# Patient Record
Sex: Female | Born: 1935 | Race: White | Hispanic: No | Smoking: Never smoker
Health system: Southern US, Community
[De-identification: ages and names within clinical notes are randomized; demographics above are authoritative.]

## PROBLEM LIST (undated history)

## (undated) DIAGNOSIS — I10 Essential (primary) hypertension: Secondary | ICD-10-CM

## (undated) DIAGNOSIS — B019 Varicella without complication: Secondary | ICD-10-CM

## (undated) HISTORY — DX: Varicella without complication: B01.9

---

## 1960-10-21 HISTORY — PX: BREAST SURGERY: SHX581

## 1960-10-21 HISTORY — PX: OTHER SURGICAL HISTORY: SHX169

## 2008-03-30 ENCOUNTER — Emergency Department (HOSPITAL_COMMUNITY): Admission: EM | Admit: 2008-03-30 | Discharge: 2008-03-30 | Payer: Self-pay | Admitting: Emergency Medicine

## 2008-03-31 ENCOUNTER — Ambulatory Visit: Payer: Self-pay | Admitting: Family Medicine

## 2008-03-31 DIAGNOSIS — R030 Elevated blood-pressure reading, without diagnosis of hypertension: Secondary | ICD-10-CM

## 2008-05-02 ENCOUNTER — Ambulatory Visit: Payer: Self-pay | Admitting: Family Medicine

## 2008-05-02 ENCOUNTER — Encounter: Payer: Self-pay | Admitting: Family Medicine

## 2008-05-03 ENCOUNTER — Ambulatory Visit: Payer: Self-pay | Admitting: Family Medicine

## 2008-05-03 ENCOUNTER — Encounter: Payer: Self-pay | Admitting: Family Medicine

## 2008-05-03 LAB — CONVERTED CEMR LAB
Cholesterol: 194 mg/dL
HDL: 65 mg/dL
LDL Cholesterol: 114 mg/dL — ABNORMAL HIGH
Total CHOL/HDL Ratio: 3
Triglycerides: 74 mg/dL
VLDL: 15 mg/dL

## 2008-05-04 ENCOUNTER — Encounter: Payer: Self-pay | Admitting: Family Medicine

## 2008-05-04 LAB — CONVERTED CEMR LAB: Pap Smear: NORMAL

## 2008-05-05 ENCOUNTER — Encounter: Payer: Self-pay | Admitting: Family Medicine

## 2008-05-17 ENCOUNTER — Encounter: Payer: Self-pay | Admitting: Family Medicine

## 2008-05-20 ENCOUNTER — Encounter: Payer: Self-pay | Admitting: Family Medicine

## 2009-03-13 IMAGING — CR DG CHEST 2V
2 series · 2 of 2 positions shown · non-contrast
Comparison: None

CLINICAL DATA: Nausea and vomiting.

CHEST - 2 VIEW

[w chest pa]
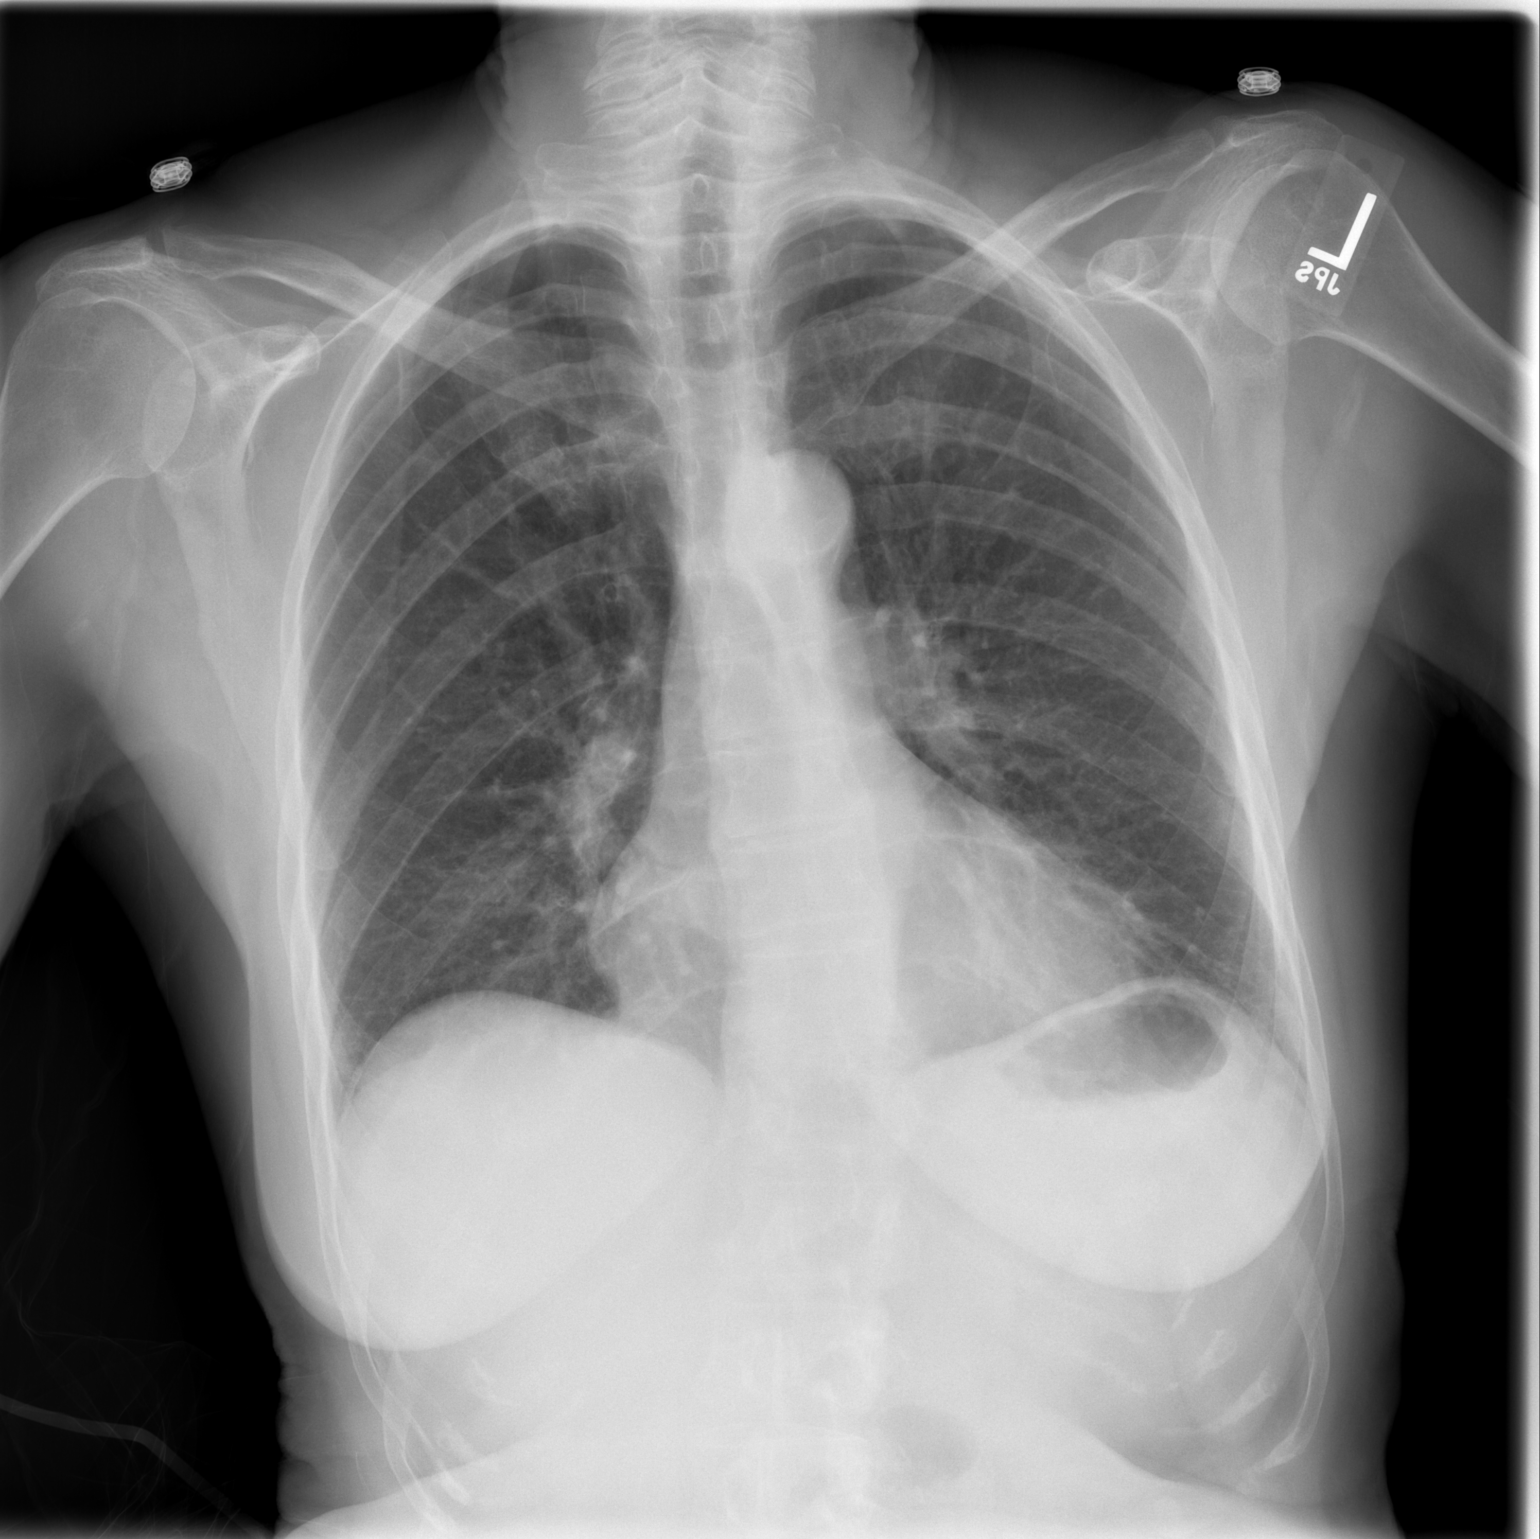

[w chest lat]
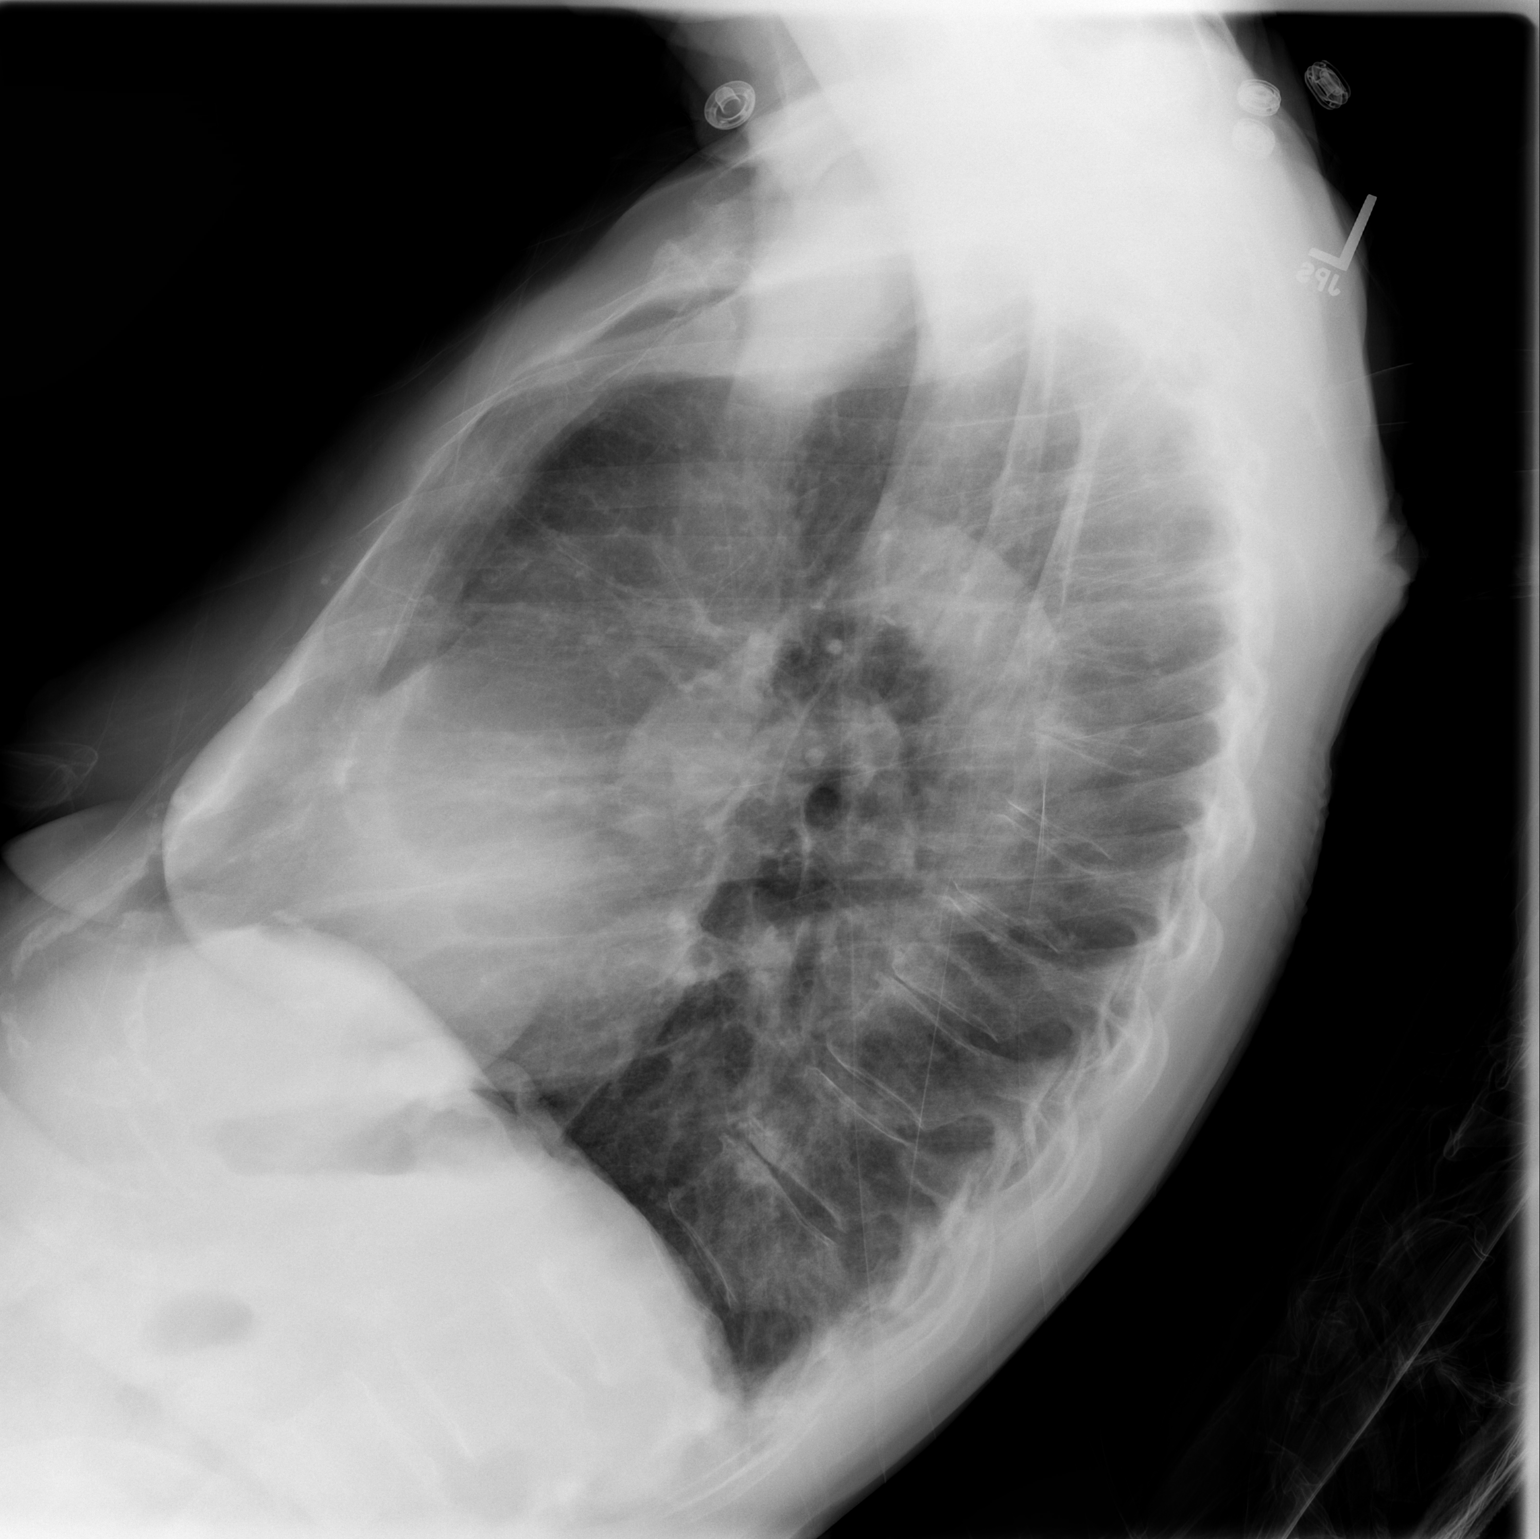

[2 of 2 positions shown; findings below may reference images not displayed]

FINDINGS: Heart size is upper normal.  There is no heart failure,
infiltrate, or effusion.  The lungs are clear and there is no mass
lesion.
IMPRESSION: No active cardiopulmonary disease.

## 2011-07-18 LAB — URINALYSIS, ROUTINE W REFLEX MICROSCOPIC
Bilirubin Urine: NEGATIVE
Glucose, UA: NEGATIVE
Hgb urine dipstick: NEGATIVE
Specific Gravity, Urine: 1.016
pH: 7.5

## 2011-07-18 LAB — COMPREHENSIVE METABOLIC PANEL
Albumin: 3.3 — ABNORMAL LOW
Alkaline Phosphatase: 62
BUN: 14
Potassium: 3.9
Total Protein: 5.6 — ABNORMAL LOW

## 2011-07-18 LAB — DIFFERENTIAL
Lymphocytes Relative: 9 — ABNORMAL LOW
Monocytes Absolute: 0.4
Monocytes Relative: 6
Neutro Abs: 6.1

## 2011-07-18 LAB — CBC
HCT: 38.7
Platelets: 234
RDW: 13.4

## 2012-12-18 ENCOUNTER — Encounter: Payer: Self-pay | Admitting: Family Medicine

## 2012-12-18 ENCOUNTER — Ambulatory Visit (INDEPENDENT_AMBULATORY_CARE_PROVIDER_SITE_OTHER): Payer: Medicare Other | Admitting: Family Medicine

## 2012-12-18 VITALS — BP 160/70 | HR 70 | Temp 98.1°F | Ht 66.0 in | Wt 126.0 lb

## 2012-12-18 DIAGNOSIS — Z0000 Encounter for general adult medical examination without abnormal findings: Secondary | ICD-10-CM

## 2012-12-18 DIAGNOSIS — Z23 Encounter for immunization: Secondary | ICD-10-CM

## 2012-12-18 DIAGNOSIS — R7309 Other abnormal glucose: Secondary | ICD-10-CM

## 2012-12-18 DIAGNOSIS — E785 Hyperlipidemia, unspecified: Secondary | ICD-10-CM

## 2012-12-18 DIAGNOSIS — R030 Elevated blood-pressure reading, without diagnosis of hypertension: Secondary | ICD-10-CM

## 2012-12-18 DIAGNOSIS — Z7189 Other specified counseling: Secondary | ICD-10-CM

## 2012-12-18 LAB — HEMOGLOBIN A1C: Hgb A1c MFr Bld: 5.7 % (ref 4.6–6.5)

## 2012-12-18 LAB — LIPID PANEL
HDL: 64.3 mg/dL (ref >39.00)
Total CHOL/HDL Ratio: 3
Triglycerides: 96 mg/dL (ref 0.0–149.0)

## 2012-12-18 NOTE — Patient Instructions (Addendum)
-  We have ordered labs or studies at this visit. It can take up to 1-2 weeks for results and processing. We will contact you with instructions IF your results are abnormal. Normal results will be released to your [facility]. If you have not heard from [place] or can not find your results in [facility] in 2 weeks please contact our office.  -PLEASE SIGN UP FOR MYCHART TODAY   We recommend the following healthy lifestyle measures: - eat a healthy diet consisting of lots of vegetables, fruits, beans, nuts, seeds, healthy meats such as white chicken and fish and whole grains.  - avoid fried foods, fast food, processed foods, sodas, red meet and other fattening foods.  - get a least 150 minutes of aerobic exercise per week.   Follow up in: 1-2 months to recheck your blood pressure and discuss labs

## 2012-12-18 NOTE — Progress Notes (Signed)
Chief Complaint  Patient presents with  . Establish Care    HPI:  Jill Jordan is here to establish care.  Last PCP and physical: last family doctor passed away, has not seen a doctor for regular care in a long time. Patient does self breast exams in shower every day. She does not do any other health maintenance measures.  Has the following chronic problems and concerns today:  Patient Active Problem List  Diagnosis  . ELEVATED BLOOD PRESSURE WITHOUT DIAGNOSIS OF HYPERTENSION   Health Maintenance: -never gets flu shots -can't remember last tdap  - wants this today -wants to do stool cards for colon cancer screening - refused colonoscopy -she refused shingles vaccine today - will think about it -refused mammogram - does self breast exams -wants to get pneumonia vaccine today   ROS: See pertinent positives and negatives per HPI. Feels great. Reports no CP, SOB, palpitations, bowel changes, weight changes, urinary symptoms.  Past Medical History  Diagnosis Date  . Chicken pox     Family History  Problem Relation Age of Onset  . Alcohol abuse Other   . Kidney disease Other   . Diabetes Other   . Hypertension Mother   . COPD Father     History   Social History  . Marital Status: Widowed    Spouse Name: N/A    Number of Children: N/A  . Years of Education: N/A   Social History Main Topics  . Smoking status: Never Smoker   . Smokeless tobacco: None  . Alcohol Use: No  . Drug Use: No  . Sexually Active: None   Other Topics Concern  . 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  EXAM:  Filed Vitals:   12/18/12 1254  BP:  160/70  Pulse: 70  Temp: 98.1 F (36.7 C)    Body mass index is 20.35 kg/(m^2).  GENERAL: vitals reviewed and listed above, alert, oriented, appears well hydrated and in no acute distress  HEENT: atraumatic, conjunttiva clear, no obvious abnormalities on inspection of external nose and ears  NECK: no obvious masses on inspection  LUNGS: clear to auscultation bilaterally, no wheezes, rales or rhonchi, good air movement  CV: HRRR, no peripheral edema  MS: moves all extremities without noticeable abnormality  PSYCH: pleasant and cooperative, no obvious depression or anxiety  ASSESSMENT AND PLAN:  Discussed the following assessment and plan:  ELEVATED BLOOD PRESSURE WITHOUT DIAGNOSIS OF HYPERTENSION  Establishing care with new doctor, encounter for  Hyperglycemia - screening for - Plan: Hemoglobin A1c  Hyperlipidemia - screening for - Plan: Lipid Panel  -We reviewed the PMH, PSH, FH, SH, Meds and Allergies.  -BP 142/80 on recheck - pt reluctant to take medications, will have her follow up in about 1-2 months to recheck BP and follow up on labs  Health Maintenance: -never gets flu shots - refused -can't remember last tdap  - wants this today - given -wants to do stool cards for colon cancer screening - refused colonoscopy, stool cards given -she  refused shingles vaccine today - will think about it -refused mammogram - does self breast exams -wants to get pneumonia vaccine today - given  -Patient advised to return or notify a doctor immediately if symptoms worsen or persist or new concerns arise.  Patient Instructions  -We have ordered labs or studies at this visit. It can take up to 1-2 weeks for results and processing. We will contact you with instructions IF your results are abnormal. Normal results will be released to your [facility]. If you have not heard from [place] or can not find your results in [facility] in 2 weeks please contact our office.  -PLEASE SIGN UP FOR MYCHART  TODAY   We recommend the following healthy lifestyle measures: - eat a healthy diet consisting of lots of vegetables, fruits, beans, nuts, seeds, healthy meats such as white chicken and fish and whole grains.  - avoid fried foods, fast food, processed foods, sodas, red meet and other fattening foods.  - get a least 150 minutes of aerobic exercise per week.   Follow up in: 1-2 months to recheck your blood pressure and discuss labs      KIM, HANNAH R.

## 2012-12-18 NOTE — Addendum Note (Signed)
Addended by: Azucena Freed on: 12/18/2012 01:46 PM   Modules accepted: Orders

## 2012-12-21 ENCOUNTER — Telehealth: Payer: Self-pay | Admitting: Family Medicine

## 2012-12-21 NOTE — Telephone Encounter (Signed)
Labs look ok. Cholesterol is a little high and diabetes screening lab is borderline. No medications needed at this time. Just make sure to keep exercising and eating a healthy diet.

## 2012-12-23 NOTE — Telephone Encounter (Signed)
Pt returned call and is aware of lab results

## 2012-12-23 NOTE — Telephone Encounter (Signed)
Left a message for pt to return call 

## 2013-01-07 ENCOUNTER — Other Ambulatory Visit (INDEPENDENT_AMBULATORY_CARE_PROVIDER_SITE_OTHER): Payer: Medicare Other

## 2013-01-07 ENCOUNTER — Telehealth: Payer: Self-pay | Admitting: Family Medicine

## 2013-01-07 DIAGNOSIS — R195 Other fecal abnormalities: Secondary | ICD-10-CM

## 2013-01-07 LAB — HEMOCCULT GUIAC POC 1CARD (OFFICE): Card #3 Date: NEGATIVE

## 2013-01-07 NOTE — Telephone Encounter (Signed)
Stool cards negative

## 2013-01-08 NOTE — Telephone Encounter (Signed)
Left a message for pt to return call 

## 2013-01-11 NOTE — Telephone Encounter (Signed)
Called and spoke with pt and pt is aware.  

## 2013-01-27 ENCOUNTER — Ambulatory Visit (INDEPENDENT_AMBULATORY_CARE_PROVIDER_SITE_OTHER): Payer: Medicare Other | Admitting: Family Medicine

## 2013-01-27 ENCOUNTER — Encounter: Payer: Self-pay | Admitting: Family Medicine

## 2013-01-27 VITALS — BP 170/74 | HR 74 | Temp 99.2°F | Wt 122.0 lb

## 2013-01-27 DIAGNOSIS — R7303 Prediabetes: Secondary | ICD-10-CM

## 2013-01-27 DIAGNOSIS — E785 Hyperlipidemia, unspecified: Secondary | ICD-10-CM

## 2013-01-27 DIAGNOSIS — R7309 Other abnormal glucose: Secondary | ICD-10-CM

## 2013-01-27 DIAGNOSIS — R030 Elevated blood-pressure reading, without diagnosis of hypertension: Secondary | ICD-10-CM

## 2013-01-27 NOTE — Progress Notes (Signed)
Chief Complaint  Patient presents with  . 1-2 month follow up    HPI:  Follow up:  Elevated BP last visit: -pt was reluctant to start medication and decided on lifestyle changes instead -lifestyle: has been doing lots of yard work, cleans housing, walking daily, has been trying to watch sodium - but does use salt of food -Denies: CP, SOB, HA, palpitations  HLD: -mild on recent labs  Prediabetes: -mild, on recent labs -no polyuria or polydipsia  ROS: See pertinent positives and negatives per HPI.  Past Medical History  Diagnosis Date  . Chicken pox     Family History  Problem Relation Age of Onset  . Alcohol abuse Other   . Kidney disease Other   . Diabetes Other   . Hypertension Mother   . COPD Father     History   Social History  . Marital Status: Widowed    Spouse Name: N/A    Number of Children: N/A  . Years of Education: N/A   Social History Main Topics  . Smoking status: Never Smoker   . Smokeless tobacco: None  . Alcohol Use: No  . Drug Use: No  . Sexually Active: None   Other Topics Concern  . 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  EXAM:  Filed Vitals:   01/27/13 1033  BP: 170/74  Pulse: 74  Temp: 99.2 F (37.3 C)  Recheck of BP after sitting 144/90  Body mass index is 19.7 kg/(m^2).  GENERAL: vitals reviewed and listed above, alert, oriented, appears well hydrated and in no acute distress  HEENT: atraumatic, conjunttiva clear, no obvious abnormalities on inspection of external nose and ears  NECK: no obvious masses on inspection  LUNGS: clear to auscultation bilaterally, no wheezes, rales or  rhonchi, good air movement  CV: HRRR, no peripheral edema  MS: moves all extremities without noticeable abnormality  PSYCH: pleasant and cooperative, no obvious depression or anxiety  ASSESSMENT AND PLAN:  Discussed the following assessment and plan:  ELEVATED BLOOD PRESSURE WITHOUT DIAGNOSIS OF HYPERTENSION  Hyperlipemia  Prediabetes  -discussed her hypertension, she feels like she has white coat hypertension, BP was better on recheck and she would prefer to not start a medicine at this time. She will purchase a home BP cuff and keep a home log to bring to the next appointment. -discussed healthy lifestyle changes -Patient advised to return or notify a doctor immediately if symptoms worsen or persist or new concerns arise.  Patient Instructions  -Buy a blood pressure cuff and check a few times per week - bring log and cuff to next appointment  We recommend the following healthy lifestyle measures: - eat a healthy diet consisting of lots of vegetables, fruits, beans, nuts, seeds, healthy meats such as white chicken and fish and whole grains.  - avoid fried foods, fast food, processed foods, sodas, red meet and other fattening foods.  - get a least 150 minutes of aerobic exercise per week.    Follow up in 2-3 months  Colin Benton R.

## 2013-01-27 NOTE — Patient Instructions (Signed)
-  Buy a blood pressure cuff and check a few times per week - bring log and cuff to next appointment  We recommend the following healthy lifestyle measures: - eat a healthy diet consisting of lots of vegetables, fruits, beans, nuts, seeds, healthy meats such as white chicken and fish and whole grains.  - avoid fried foods, fast food, processed foods, sodas, red meet and other fattening foods.  - get a least 150 minutes of aerobic exercise per week.    Follow up in 2-3 months

## 2013-03-31 ENCOUNTER — Ambulatory Visit (INDEPENDENT_AMBULATORY_CARE_PROVIDER_SITE_OTHER): Payer: Medicare Other | Admitting: Family Medicine

## 2013-03-31 ENCOUNTER — Encounter: Payer: Self-pay | Admitting: Family Medicine

## 2013-03-31 VITALS — BP 162/80 | Temp 97.7°F | Wt 120.0 lb

## 2013-03-31 DIAGNOSIS — IMO0001 Reserved for inherently not codable concepts without codable children: Secondary | ICD-10-CM | POA: Insufficient documentation

## 2013-03-31 DIAGNOSIS — I10 Essential (primary) hypertension: Secondary | ICD-10-CM

## 2013-03-31 NOTE — Patient Instructions (Addendum)
-  continue to check blood pressure several times per week and keep a log  -follow up in 6 months for physical exam

## 2013-03-31 NOTE — Progress Notes (Signed)
Chief Complaint  Patient presents with  . Hypertension    HPI:  Follow up:  Elevated BP: -pt has been very reluctant to start BP medication and feels she has white coat HTN - she is to bring home log today -home readings: ranging from 107/61-137/73 -denies: CP, HA, palpitations, swelling -she reports she always gets anxious about doctor's appts and always has elevated in office BP readings, but normal home BPs -she is very active and eats healthy  ROS: See pertinent positives and negatives per HPI.  Past Medical History  Diagnosis Date  . Chicken pox     Family History  Problem Relation Age of Onset  . Alcohol abuse Other   . Kidney disease Other   . Diabetes Other   . Hypertension Mother   . COPD Father     History   Social History  . Marital Status: Widowed    Spouse Name: N/A    Number of Children: N/A  . Years of Education: N/A   Social History Main Topics  . Smoking status: Never Smoker   . Smokeless tobacco: None  . Alcohol Use: No  . Drug Use: No  . Sexually Active: None   Other Topics Concern  . 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  EXAM:  Filed Vitals:   03/31/13 0843  BP: 162/80  Temp: 97.7 F (36.5 C)  Pt cuff reading: 173/85  Body mass index is 19.38 kg/(m^2).  GENERAL: vitals reviewed and listed above, alert, oriented, appears well hydrated and in no acute distress  HEENT: atraumatic, conjunttiva clear, no obvious abnormalities on inspection of external nose and ears  NECK: no obvious masses on inspection  LUNGS: clear to auscultation bilaterally, no wheezes, rales or rhonchi, good air  movement  CV: HRRR, no peripheral edema  MS: moves all extremities without noticeable abnormality  PSYCH: pleasant and cooperative, no obvious depression or anxiety  ASSESSMENT AND PLAN:  Discussed the following assessment and plan:  White coat hypertension  -it appears she does have likely white coat hypertension given home compared to office readings and cuff reading c/w office reading today -discussed implications -will follow and she will continue to check BP at home and keep a log -follow up in 6 months for preventive visit -Patient advised to return or notify a doctor immediately if symptoms worsen or persist or new concerns arise.   Patient Instructions  -continue to check blood pressure several times per week and keep a log  -follow up in 6 months for physical exam     Roby Donaway R.

## 2013-09-27 ENCOUNTER — Encounter: Payer: Self-pay | Admitting: Family Medicine

## 2013-09-27 ENCOUNTER — Ambulatory Visit (INDEPENDENT_AMBULATORY_CARE_PROVIDER_SITE_OTHER): Payer: Medicare Other | Admitting: Family Medicine

## 2013-09-27 VITALS — BP 132/84 | Temp 97.8°F | Ht 66.0 in | Wt 119.0 lb

## 2013-09-27 DIAGNOSIS — IMO0001 Reserved for inherently not codable concepts without codable children: Secondary | ICD-10-CM

## 2013-09-27 DIAGNOSIS — R030 Elevated blood-pressure reading, without diagnosis of hypertension: Secondary | ICD-10-CM

## 2013-09-27 DIAGNOSIS — I10 Essential (primary) hypertension: Secondary | ICD-10-CM

## 2013-09-27 DIAGNOSIS — Z0000 Encounter for general adult medical examination without abnormal findings: Secondary | ICD-10-CM

## 2013-09-27 NOTE — Progress Notes (Signed)
Pre visit review using our clinic review tool, if applicable. No additional management support is needed unless otherwise documented below in the visit note. 

## 2013-09-27 NOTE — Progress Notes (Signed)
Medicare Annual Preventive Care Visit  (initial annual wellness or annual wellness exam)  Concerns or follow up issues:  [facility]: -has home BP log, with cuff calibrated with office cuff -120-130s/60-70s, she checks several times per week -denies: CP, SOB, swelling, palpitation -always up at doctor appts - she gets nervous for appt  1.) Patient-completed health risk assessment  - completed and reviewed, see scanned documentation  2.) Review of Medical History: -PMH, PSH, Family History and current specialty and care providers reviewed and updated and listed below  - see chart and below  3.) Review of functional ability and level of safety:  Any difficulty hearing?  NO  History of falling? NO  Any trouble with IADLs - using a phone, using transportation, grocery shopping, preparing meals, doing housework, doing laundry, taking medications and managing money? NO  Advance Directives? YES   See summary of recommendations in Patient Instructions below.  4.) Physical Exam Filed Vitals:   09/27/13 0938  BP: 132/84  Temp: 97.8 F (36.6 C)   Estimated body mass index is 19.22 kg/(m^2) as calculated from the following:   Height as of this encounter: 5\' 6"  (1.676 m).   Weight as of this encounter: 119 lb (53.978 kg).  Mini Cog: 1. Patient instructed to listen carefully and repeat the following: Apple Watch    Penny  2. Clock drawing test was administered: NORMAL     ABNORMAL  3. Recall of three words: 3/3  Patient Score: NEG   See patient instructions for recommendations.  4)The following written screening schedule of preventive measures were reviewed with assessment and plan made per below, orders and patient instructions:           Alcohol screening: done     Obesity Screening and counseling: done     STI screening: offered     Tobacco Screening: done       Pneumococcal  ASSESSMENT/PLAN: done 11/2012      Screening mammograph (yearly if >40) ASSESSMENT/PLAN:  patient refused, does regular self breast exams      Screening Pap smear/pelvic exam (q2 years) ASSESSMENT/PLAN: N/A due to age and patient refused      Colorectal cancer screening (FOBT yearly or flex sig q4y or colonoscopy q10y or barium enema q4y) ASSESSMENT/PLAN: Doing stool cards yearly      Diabetes outpatient self-management training services ASSESSMENT/PLAN: N/A      Bone mass measurements(covered q2y if indicated - estrogen def, osteoporosis, hyperparathyroid, vertebral abnormalities, osteoporosis or steroids) ASSESSMENT/PLAN: Refused - advised vitamin D3 1000-2000 IU daily and calcium (1200mg  total from food and supplement)      Screening for glaucoma(q1y if high risk - diabetes, FH, AA and > 50 or hispanic and > 65) ASSESSMENT/PLAN: patient to set up eye appointment      Medical nutritional therapy for individuals with diabetes or renal disease ASSESSMENT/PLAN: N/A      Cardiovascular screening blood tests (lipids q5y) ASSESSMENT/PLAN: done      Diabetes screening tests ASSESSMENT/PLAN:done   7.) Summary: -risk factors and conditions per above assessment were discussed and treatment, recommendations and referrals were offered per documentation above and orders and patient instructions.

## 2013-09-27 NOTE — Patient Instructions (Signed)
Alcohol screening: done     Obesity Screening and counseling: done     STI screening: offered     Tobacco Screening: done       Pneumococcal  ASSESSMENT/PLAN: done 11/2012      Screening mammograph (yearly if >40) ASSESSMENT/PLAN: patient refused, does regular self breast exams      Screening Pap smear/pelvic exam (q2 years) ASSESSMENT/PLAN: N/A due to age and patient refused      Colorectal cancer screening (FOBT yearly or flex sig q4y or colonoscopy q10y or barium enema q4y) ASSESSMENT/PLAN: Doing stool cards yearly      Diabetes outpatient self-management training services ASSESSMENT/PLAN: N/A      Bone mass measurements(covered q2y if indicated - estrogen def, osteoporosis, hyperparathyroid, vertebral abnormalities, osteoporosis or steroids) ASSESSMENT/PLAN: Refused - advised vitamin D3 1000-2000 IU daily and calcium (1200mg  total from food and supplement)      Screening for glaucoma(q1y if high risk - diabetes, FH, AA and > 50 or hispanic and > 65) ASSESSMENT/PLAN: patient to set up eye appointment      Medical nutritional therapy for individuals with diabetes or renal disease ASSESSMENT/PLAN: N/A      Cardiovascular screening blood tests (lipids q5y) ASSESSMENT/PLAN: done      Diabetes screening tests ASSESSMENT/PLAN:done

## 2013-09-29 ENCOUNTER — Encounter: Payer: Medicare Other | Admitting: Family Medicine

## 2014-08-22 ENCOUNTER — Encounter: Payer: Self-pay | Admitting: Family Medicine

## 2019-12-12 ENCOUNTER — Ambulatory Visit: Payer: Medicare Other | Attending: Internal Medicine

## 2019-12-12 DIAGNOSIS — Z23 Encounter for immunization: Secondary | ICD-10-CM | POA: Insufficient documentation

## 2019-12-12 NOTE — Progress Notes (Signed)
   Covid-19 Vaccination Clinic  Name:  Jill Jordan    MRN: [id number] DOB: [birth year]  12/12/2019  Jill Jordan was observed post Covid-19 immunization for 15 minutes without incidence. She was provided with Vaccine Information Sheet and instruction to access the V-Safe system.   Jill Jordan was instructed to call 911 with any severe reactions post vaccine: Marland Kitchen Difficulty breathing  . Swelling of your face and throat  . A fast heartbeat  . A bad rash all over your body  . Dizziness and weakness    Immunizations Administered    Name Date Dose VIS Date Route   Pfizer COVID-19 Vaccine 12/12/2019 10:44 AM 0.3 mL 10/01/2019 Intramuscular   Manufacturer: [place]   Lot: [id number]   [place]: [id number]

## 2020-01-05 ENCOUNTER — Ambulatory Visit: Payer: Medicare Other | Attending: Internal Medicine

## 2020-01-05 DIAGNOSIS — Z23 Encounter for immunization: Secondary | ICD-10-CM

## 2020-01-05 NOTE — Progress Notes (Signed)
   Covid-19 Vaccination Clinic  Name:  Tayten Crittenden    MRN: [id number] DOB: 09-30-1932  01/05/2020  Ms. Odriscoll was observed post Covid-19 immunization for 15 minutes without incident. She was provided with Vaccine Information Sheet and instruction to access the V-Safe system.   Ms. Freshley was instructed to call 911 with any severe reactions post vaccine: Marland Kitchen Difficulty breathing  . Swelling of face and throat  . A fast heartbeat  . A bad rash all over body  . Dizziness and weakness   Immunizations Administered    Name Date Dose VIS Date Route   Pfizer COVID-19 Vaccine 01/05/2020 10:18 AM 0.3 mL 10/01/2019 Intramuscular   Manufacturer: [place]   Lot: [id number]   [place]: [id number]

## 2020-04-17 ENCOUNTER — Other Ambulatory Visit: Payer: Self-pay

## 2020-04-17 ENCOUNTER — Encounter: Payer: Self-pay | Admitting: Family Medicine

## 2020-04-17 ENCOUNTER — Ambulatory Visit: Payer: Medicare Other | Admitting: Family Medicine

## 2020-04-17 VITALS — BP 160/80 | HR 100 | Temp 97.6°F | Ht 66.0 in | Wt 120.2 lb

## 2020-04-17 DIAGNOSIS — R5383 Other fatigue: Secondary | ICD-10-CM | POA: Diagnosis not present

## 2020-04-17 DIAGNOSIS — Z13220 Encounter for screening for lipoid disorders: Secondary | ICD-10-CM

## 2020-04-17 DIAGNOSIS — E559 Vitamin D deficiency, unspecified: Secondary | ICD-10-CM | POA: Diagnosis not present

## 2020-04-17 DIAGNOSIS — E538 Deficiency of other specified B group vitamins: Secondary | ICD-10-CM | POA: Diagnosis not present

## 2020-04-17 DIAGNOSIS — I10 Essential (primary) hypertension: Secondary | ICD-10-CM

## 2020-04-17 NOTE — Progress Notes (Signed)
Jill Jordan DOB: 08/04/1932 Encounter date: 04/17/2020  This is a 86 y.o. female who presents to establish care. Chief Complaint  Patient presents with  . Establish Care    History of present illness: Has not been seen in our office since 2014  "I don't have no energy". Felt like there was a change when she turned 88.   Maybe sleeps 5-6 hours then gets up. Up more if she is drinking more. Generally feels rested when she gets up. She is always working; has garden. Legs just get tired. Feels better after rest. Not sure if weak or not wanting to carry body. No numbness, tingling in legs. Does have back pain if bent over picking beans for a couple of hours.   Does some general housework.   Doesn't check her blood pressure at home.   After COVID shots when she went to turn over felt swimmy headed, which lasted 2-3 weeks intermittently.   Appetite has been good. Eats a well rounded diet. Eats healthy; enjoys vegetables.   Does take Vitamin D3 daily (has K in it)   Past Medical History:  Diagnosis Date  . Chicken pox    Past Surgical History:  Procedure Laterality Date  . BREAST SURGERY Left 1962  . removal of small tumor left breast  1962   No Known Allergies Current Meds  Medication Sig  . VITAMIN D PO Take by mouth daily.   Social History   Tobacco Use  . Smoking status: Never Smoker  . Smokeless tobacco: Never Used  Substance Use Topics  . Alcohol use: No   Family History  Problem Relation Age of Onset  . Alcohol abuse Other   . Kidney disease Other   . Hypertension Mother 18       lived to be 91  . COPD Father   . Heart disease Brother   . Healthy Brother   . Diabetes Neg Hx   . Cancer Neg Hx      Review of Systems  Constitutional: Positive for fatigue. Negative for activity change, appetite change, chills, fever and unexpected weight change.  HENT: Negative for congestion, ear pain, hearing loss, sinus pressure, sinus pain, sore throat and trouble  swallowing.   Eyes: Negative for pain and visual disturbance.  Respiratory: Negative for cough, chest tightness, shortness of breath and wheezing.   Cardiovascular: Negative for chest pain, palpitations and leg swelling.  Gastrointestinal: Negative for abdominal pain, blood in stool, constipation, diarrhea, nausea and vomiting.  Genitourinary: Negative for difficulty urinating and menstrual problem.  Musculoskeletal: Negative for arthralgias and back pain.  Skin: Negative for rash.  Neurological: Negative for dizziness, weakness, numbness and headaches.  Hematological: Negative for adenopathy. Does not bruise/bleed easily.  Psychiatric/Behavioral: Negative for sleep disturbance and suicidal ideas. The patient is not nervous/anxious.     Objective:  BP (!) 160/80 (BP Location: Left Arm, Patient Position: Sitting, Cuff Size: Normal)   Pulse 100   Temp 97.6 F (36.4 C) (Temporal)   Ht 5\' 6"  (1.676 m)   Wt 120 lb 3.2 oz (54.5 kg)   BMI 19.40 kg/m   Weight: 120 lb 3.2 oz (54.5 kg)   BP Readings from Last 3 Encounters:  04/17/20 (!) 160/80  09/27/13 132/84  03/31/13 (!) 162/80   Wt Readings from Last 3 Encounters:  04/17/20 120 lb 3.2 oz (54.5 kg)  09/27/13 119 lb (54 kg)  03/31/13 120 lb (54.4 kg)   Patient's home blood pressure this morning was 165/80 with  a heart rate of 63.  My recheck in the office of blood pressure was 170/60 with a heart rate of 58 noted on EKG.  Physical Exam Constitutional:      General: She is not in acute distress.    Appearance: She is well-developed.  Cardiovascular:     Rate and Rhythm: Normal rate and regular rhythm.     Pulses:          Dorsalis pedis pulses are 1+ on the right side and 1+ on the left side.     Heart sounds: Murmur heard.  Systolic murmur is present with a grade of 2/6.  No friction rub.  Pulmonary:     Effort: Pulmonary effort is normal. No respiratory distress.     Breath sounds: Normal breath sounds. No wheezing or rales.   Musculoskeletal:     Right lower leg: No edema.     Left lower leg: No edema.  Neurological:     Mental Status: She is alert and oriented to person, place, and time.  Psychiatric:        Attention and Perception: Attention normal.        Mood and Affect: Mood normal.        Speech: Speech normal.        Behavior: Behavior normal.        Cognition and Memory: Cognition normal.     Assessment/Plan:  1. Fatigue, unspecified type EKG does show right bundle branch block, but I do not have prior for comparison.  We will start with blood work, and consider further evaluation pending these results.  We discussed possibility of cardiology work-up, perhaps including 2D echo.  Overall, her energy level is quite good for age she is very active for long hours in her garden and with canning daily.  It has however been a decrease in energy level for her in recent months, so we will further investigate.  She would like to do what she can do to prevent heart attack or stroke.  EKG is bradycardic with no acute changes.  Right bundle branch block noted. - EKG 12-Lead - CBC with Differential/Platelet; Future - Comprehensive metabolic panel; Future - TSH; Future - Urinalysis with Reflex Microscopic; Future - Culture, Urine; Future  2. Lipid screening - Lipid panel; Future  3. Vitamin D deficiency - VITAMIN D 25 Hydroxy (Vit-D Deficiency, Fractures); Future  4. B12 deficiency - Vitamin B12; Future  5.  Elevated blood pressure I am concerned with pulse pressure elevation.  Diastolic for me on 2 checks in the office was around 60, high 50s.  Systolic was 1 17-9 70 in the office.  I am going to have her check blood pressures at home and report back numbers to me.  In the meanwhile we will get blood work.  We discussed being cautious not to over treat blood pressure since that may cause her to feel more dizzy or lightheaded.  Follow-up pending blood work results.  Return for pending  bloodwork.  Micheline Rough, MD   Time spent with patient, chart review and charting, discussion of lab work and potential evaluation 40 minutes total.

## 2022-09-10 ENCOUNTER — Ambulatory Visit: Payer: Medicare Other | Admitting: Family Medicine

## 2022-09-10 ENCOUNTER — Encounter: Payer: Self-pay | Admitting: Family Medicine

## 2022-09-10 VITALS — BP 120/60 | HR 63 | Temp 98.1°F | Ht 66.0 in | Wt 113.2 lb

## 2022-09-10 DIAGNOSIS — N6320 Unspecified lump in the left breast, unspecified quadrant: Secondary | ICD-10-CM | POA: Insufficient documentation

## 2022-09-10 DIAGNOSIS — N6323 Unspecified lump in the left breast, lower outer quadrant: Secondary | ICD-10-CM

## 2022-09-10 NOTE — Progress Notes (Signed)
   Established Patient Office Visit  Subjective   Patient ID: Shantanique Hodo, female    DOB: 02-27-1932  Age: 88 y.o. MRN: [id number]  Chief Complaint  Patient presents with  . Breast Mass    Patient complains of painful lump noted in the left breast x2 weeks, also states she had a lump in 1962 which was normal after testing    Pt is reporting 2 week history of a lump in the left breast. States she thinks it is getting bigger in size. It is not tender, states she can move it, states it feels hard like a marble.   {History (Optional):23778}  ROS    Objective:     BP 120/60 (BP Location: Left Arm, Patient Position: Sitting, Cuff Size: Normal)   Pulse 63   Temp 98.1 F (36.7 C) (Oral)   Ht '5\' 6"'$  (1.676 m)   Wt 113 lb 3.2 oz (51.3 kg)   SpO2 98%   BMI 18.27 kg/m  {Vitals History (Optional):23777}  Physical Exam   No results found for any visits on 09/10/22.  {Labs (Optional):23779}  The ASCVD Risk score (Arnett DK, et al., 2019) failed to calculate for the following reasons:   The 2019 ASCVD risk score is only valid for ages 74 to 14    Assessment & Plan:   Problem List Items Addressed This Visit   None   No follow-ups on file.    Farrel Conners, MD

## 2022-09-16 ENCOUNTER — Encounter: Payer: Medicare Other | Admitting: Family Medicine

## 2022-09-17 ENCOUNTER — Emergency Department (HOSPITAL_BASED_OUTPATIENT_CLINIC_OR_DEPARTMENT_OTHER)
Admission: EM | Admit: 2022-09-17 | Discharge: 2022-09-17 | Disposition: A | Discharge status: Home / Self Care | Payer: Medicare Other | Attending: Emergency Medicine | Admitting: Emergency Medicine

## 2022-09-17 ENCOUNTER — Encounter (HOSPITAL_COMMUNITY): Payer: Self-pay | Admitting: Emergency Medicine

## 2022-09-17 ENCOUNTER — Emergency Department (HOSPITAL_COMMUNITY): Admission: EM | Admit: 2022-09-17 | Payer: Medicare Other | Source: Home / Self Care

## 2022-09-17 ENCOUNTER — Other Ambulatory Visit: Payer: Self-pay

## 2022-09-17 ENCOUNTER — Encounter (HOSPITAL_BASED_OUTPATIENT_CLINIC_OR_DEPARTMENT_OTHER): Payer: Self-pay | Admitting: Emergency Medicine

## 2022-09-17 DIAGNOSIS — R55 Syncope and collapse: Secondary | ICD-10-CM

## 2022-09-17 DIAGNOSIS — R030 Elevated blood-pressure reading, without diagnosis of hypertension: Secondary | ICD-10-CM

## 2022-09-17 DIAGNOSIS — R42 Dizziness and giddiness: Secondary | ICD-10-CM

## 2022-09-17 LAB — COMPREHENSIVE METABOLIC PANEL
ALT: 12 U/L (ref 0–44)
AST: 15 U/L (ref 15–41)
Albumin: 3.6 g/dL (ref 3.5–5.0)
Alkaline Phosphatase: 72 U/L (ref 38–126)
Anion gap: 12 (ref 5–15)
BUN: 12 mg/dL (ref 8–23)
CO2: 27 mmol/L (ref 22–32)
Calcium: 9.2 mg/dL (ref 8.9–10.3)
Chloride: 100 mmol/L (ref 98–111)
Creatinine, Ser: 0.81 mg/dL (ref 0.44–1.00)
GFR, Estimated: >60 mL/min (ref >60)
Glucose, Bld: 106 mg/dL — ABNORMAL HIGH (ref 70–99)
Potassium: 4.3 mmol/L (ref 3.5–5.1)
Sodium: 139 mmol/L (ref 135–145)
Total Bilirubin: 0.7 mg/dL (ref 0.3–1.2)
Total Protein: 6.2 g/dL — ABNORMAL LOW (ref 6.5–8.1)

## 2022-09-17 LAB — CBC WITH DIFFERENTIAL/PLATELET
Abs Immature Granulocytes: 0.03 10*3/uL (ref 0.00–0.07)
Basophils Absolute: 0 10*3/uL (ref 0.0–0.1)
Basophils Relative: 1 %
Eosinophils Absolute: 0 10*3/uL (ref 0.0–0.5)
Eosinophils Relative: 0 %
HCT: 43.1 % (ref 36.0–46.0)
Hemoglobin: 13.4 g/dL (ref 12.0–15.0)
Immature Granulocytes: 1 %
Lymphocytes Relative: 17 %
Lymphs Abs: 0.9 10*3/uL (ref 0.7–4.0)
MCH: 26.1 pg (ref 26.0–34.0)
MCHC: 31.1 g/dL (ref 30.0–36.0)
MCV: 83.9 fL (ref 80.0–100.0)
Monocytes Absolute: 0.4 10*3/uL (ref 0.1–1.0)
Monocytes Relative: 8 %
Neutro Abs: 4 10*3/uL (ref 1.7–7.7)
Neutrophils Relative %: 73 %
Platelets: 292 10*3/uL (ref 150–400)
RBC: 5.14 MIL/uL — ABNORMAL HIGH (ref 3.87–5.11)
RDW: 16.5 % — ABNORMAL HIGH (ref 11.5–15.5)
WBC: 5.5 10*3/uL (ref 4.0–10.5)
nRBC: 0 % (ref 0.0–0.2)

## 2022-09-17 LAB — URINALYSIS, ROUTINE W REFLEX MICROSCOPIC
Bilirubin Urine: NEGATIVE
Glucose, UA: NEGATIVE mg/dL
Hgb urine dipstick: NEGATIVE
Ketones, ur: NEGATIVE mg/dL
Leukocytes,Ua: NEGATIVE
Nitrite: NEGATIVE
Protein, ur: NEGATIVE mg/dL
Specific Gravity, Urine: 1.005 (ref 1.005–1.030)
pH: 7 (ref 5.0–8.0)

## 2022-09-17 MED ORDER — SODIUM CHLORIDE 0.9 % IV BOLUS
1000.0000 mL | Freq: Once | INTRAVENOUS | Status: AC
  Administered 2022-09-17: 1000 mL via INTRAVENOUS

## 2022-09-17 NOTE — ED Triage Notes (Signed)
Pt arrives to ED with c/o dizziness and nausea. These symptoms started yesterday and have continued into today. Had CBC, CMP, EKG completed at [facility] ED today before she LWBS. HTN in triage, no past hx of same.

## 2022-09-17 NOTE — ED Provider Notes (Signed)
[facility] EMERGENCY DEPT Provider Note   CSN: [id number] Arrival date & time: 09/17/22  1451     History  Chief Complaint  Patient presents with   Dizziness   Nausea   Hypertension    Jill Jordan is a 88 y.o. female.  Pt indicates intermittently  feeling lightheaded since yesterday AM. Symptoms come and go, symptoms somewhat worse w standing. No syncope. No associated palpitations or sense of rapid or irregular beating. No chest pain or discomfort of any sort. No sob or unusual doe. No abd pain or nvd. No dysuria or gu c/o. No recent blood loss, rectal bleeding or melena. No recent change in meds. No cough or uri symptoms. No fever or chills. Pt denies headache. No change in speech or vision. No room spinning. No tinnitus, ear pain or hearing loss. No problems w gait, walks on own without cane/walker, no unsteadiness.   The history is provided by the patient and medical records.  Dizziness Associated symptoms: no blood in stool, no chest pain, no diarrhea, no headaches, no palpitations, no shortness of breath, no vomiting and no weakness   Hypertension Pertinent negatives include no chest pain, no abdominal pain, no headaches and no shortness of breath.       Home Medications Prior to Admission medications   Medication Sig Start Date End Date Taking? Authorizing Provider  Multiple Vitamins-Minerals (MULTIVITAMIN GUMMIES ADULT PO) Take by mouth daily.    [provider]  VITAMIN D PO Take by mouth daily.    [provider]      Allergies    Patient has no known allergies.    Review of Systems   Review of Systems  Constitutional:  Negative for chills, diaphoresis and fever.  HENT:  Negative for sore throat.   Eyes:  Negative for visual disturbance.  Respiratory:  Negative for cough and shortness of breath.   Cardiovascular:  Negative for chest pain, palpitations and leg swelling.  Gastrointestinal:  Negative for abdominal pain, blood in  stool, diarrhea and vomiting.  Genitourinary:  Negative for dysuria and flank pain.  Musculoskeletal:  Negative for back pain and neck pain.  Skin:  Negative for rash.  Neurological:  Positive for dizziness and light-headedness. Negative for speech difficulty, weakness, numbness and headaches.  Hematological:  Does not bruise/bleed easily.  Psychiatric/Behavioral:  Negative for confusion.     Physical Exam Updated Vital Signs BP (!) 171/71   Pulse 64   Temp 98.3 F (36.8 C)   Resp 14   Ht 1.676 m ('5\' 6"'$ )   Wt 51.3 kg   SpO2 100%   BMI 18.24 kg/m  Physical Exam Vitals and nursing note reviewed.  Constitutional:      Appearance: Normal appearance. She is well-developed.  HENT:     Head: Atraumatic.     Nose: Nose normal.     Mouth/Throat:     Mouth: Mucous membranes are moist.  Eyes:     General: No scleral icterus.    Conjunctiva/sclera: Conjunctivae normal.     Pupils: Pupils are equal, round, and reactive to light.  Neck:     Vascular: No carotid bruit.     Trachea: No tracheal deviation.     Comments: Trachea midline. Thyroid not grossly enlarged or tender.  Cardiovascular:     Rate and Rhythm: Normal rate and regular rhythm.     Pulses: Normal pulses.     Heart sounds: Normal heart sounds. No murmur heard.    No friction  rub. No gallop.  Pulmonary:     Effort: Pulmonary effort is normal. No respiratory distress.     Breath sounds: Normal breath sounds.  Abdominal:     General: Bowel sounds are normal. There is no distension.     Palpations: Abdomen is soft. There is no mass.     Tenderness: There is no abdominal tenderness.  Genitourinary:    Comments: No cva tenderness.  Musculoskeletal:        General: No swelling or tenderness.     Cervical back: Normal range of motion and neck supple. No rigidity. No muscular tenderness.     Right lower leg: No edema.     Left lower leg: No edema.  Skin:    General: Skin is warm and dry.     Findings: No rash.   Neurological:     Mental Status: She is alert.     Comments: Alert, speech normal. No dysarthria or aphasia. Motor fxn intact bil, stre 5/5, no pronator drift. Sens grossly intact bil. Steady gait, no ataxia.   Psychiatric:        Mood and Affect: Mood normal.     ED Results / Procedures / Treatments   Labs (all labs ordered are listed, but only abnormal results are displayed) Labs from today, k normal. Hgb normal.      EKG EKG Interpretation  Date/Time:  Tuesday September 17 2022 20:16:55 EST Ventricular Rate:  66 PR Interval:  203 QRS Duration: 136 QT Interval:  472 QTC Calculation: 495 R Axis:   61 Text Interpretation: Sinus rhythm Right bundle branch block Non-specific ST-t changes Confirmed by Lajean Saver [phone]) on 09/17/2022 8:20:36 PM  Radiology No results found.  Procedures Procedures    Medications Ordered in ED Medications  sodium chloride 0.9 % bolus 1,000 mL (1,000 mLs Intravenous New Bag/Given 09/17/22 1951)    ED Course/ Medical Decision Making/ A&P                           Medical Decision Making Problems Addressed: Elevated blood pressure reading: acute illness or injury Lightheadedness: acute illness or injury with systemic symptoms Near syncope: acute illness or injury with systemic symptoms that poses a threat to life or bodily functions  Amount and/or Complexity of Data Reviewed External Data Reviewed: notes. Labs:  Decision-making details documented in ED Course. ECG/medicine tests: ordered and independent interpretation performed. Decision-making details documented in ED Course.  Risk Decision regarding hospitalization.   Iv ns. Continuous pulse ox and cardiac monitoring. Labs ordered/sent. Imaging ordered.   Diff dx includes anemia, dehydration, orthostasis, etc, - dispo decision including potential need for admission considered - will check earlier labs, monitor, ecg, check orthostasics, give fluids, and reassess.   Reviewed  nursing notes and prior charts for additional history. External reports reviewed - had labs earlier today in other ED, but wait too long.   Cardiac monitor: sinus rhythm, rate 64.  Earlier labs reviewed/interpreted by me - chem normal. Hgb normal. Ua neg for uti.   Ns bolus. Po fluids/food. Ambulated in hall.  Pt with steady gait. No faintness or current symptoms. Systolic bp elevated, 962/95, but does not require emergently lowering - rec pcp f/u.  Rec close pcp f/u.  Return precautions provided.            Final Clinical Impression(s) / ED Diagnoses Final diagnoses:  None    Rx / DC Orders ED Discharge Orders  None         Lajean Saver, MD 09/17/22 2035

## 2022-09-17 NOTE — Discharge Instructions (Addendum)
It was our pleasure to provide your ER care today - we hope that you feel better.  Drink plenty of fluids/stay well hydrated.  Your blood pressure is high - limit salt intake, see attached information, and follow up closely with your doctor.   Follow up closely with your doctor in the next 1-2 days - call office tomorrow AM to arrange appointment. Make sure to have your blood pressure rechecked then, as it is high today.  Return to ER right away if worse, new symptoms, chest pain, trouble breathing, fainting, fevers, trouble with walking/balance, numbness/weakness, change in speech or vision, or other concern.

## 2022-09-17 NOTE — ED Notes (Signed)
No longer wanted to wait

## 2022-09-17 NOTE — ED Provider Triage Note (Signed)
Emergency Medicine Provider Triage Evaluation Note  Leone Mobley , a 88 y.o. female  was evaluated in triage.  Pt complains of 1 day of increased dizziness.  Patient states she woke up yesterday and felt dizzy.  She describes the dizziness as feeling off balance.  She denies feeling like the room is spinning.  She states that she was dehydrated in the past which caused similar symptoms.  She denies any nausea, vomiting, chest pain, shortness of breath, headache.  Review of Systems  Positive: As above Negative: As above  Physical Exam  BP (!) 217/86 (BP Location: Left Arm)   Pulse 68   Temp 97.8 F (36.6 C) (Oral)   Resp 15   SpO2 99%  Gen:   Awake, no distress   Resp:  Normal effort  MSK:   Moves extremities without difficulty  Other:    Medical Decision Making  Medically screening exam initiated at 10:48 AM.  Appropriate orders placed.  Jailyne Chieffo was informed that the remainder of the evaluation will be completed by another provider, this initial triage assessment does not replace that evaluation, and the importance of remaining in the ED until their evaluation is complete.     Dorothyann Peng, PA-C 09/17/22 1051

## 2022-09-17 NOTE — ED Triage Notes (Signed)
Pt complains of feeling dizzy and nauseous. Pt denies cp/sob/ headache. Pt denies any weakness or gait abnormality

## 2022-09-19 ENCOUNTER — Encounter: Payer: Self-pay | Admitting: Family Medicine

## 2022-09-19 ENCOUNTER — Ambulatory Visit: Payer: Medicare Other | Admitting: Family Medicine

## 2022-09-19 VITALS — BP 180/90 | HR 63 | Temp 97.7°F | Ht 66.0 in | Wt 114.5 lb

## 2022-09-19 DIAGNOSIS — I10 Essential (primary) hypertension: Secondary | ICD-10-CM | POA: Diagnosis not present

## 2022-09-19 DIAGNOSIS — N6323 Unspecified lump in the left breast, lower outer quadrant: Secondary | ICD-10-CM

## 2022-09-19 DIAGNOSIS — Z13220 Encounter for screening for lipoid disorders: Secondary | ICD-10-CM | POA: Diagnosis not present

## 2022-09-19 LAB — LIPID PANEL
Cholesterol: 205 mg/dL — ABNORMAL HIGH (ref 0–200)
HDL: 61.2 mg/dL (ref >39.00)
LDL Cholesterol: 118 mg/dL — ABNORMAL HIGH (ref 0–99)
NonHDL: 143.45
Total CHOL/HDL Ratio: 3
Triglycerides: 129 mg/dL (ref 0.0–149.0)
VLDL: 25.8 mg/dL (ref 0.0–40.0)

## 2022-09-19 LAB — TSH: TSH: 2.31 u[IU]/mL (ref 0.35–5.50)

## 2022-09-19 MED ORDER — AMLODIPINE BESYLATE 5 MG PO TABS: 5.0000 mg | ORAL_TABLET | Freq: Every day | ORAL | 1 refills | Status: DC

## 2022-09-19 NOTE — Patient Instructions (Signed)
Check blood pressure daily, your goal BP is anything less than 140/90,

## 2022-09-19 NOTE — Assessment & Plan Note (Signed)
Pt has consistently elevated blood pressures, we discussed starting amlodipine and she is agreeable. Will start 5 mg daily at bedtime and pt will continue to check her BP at home. Will RTC in 2 months for BP recheck. I will order TSH and lipid panel for risk factor assessment. I will also order renal artery duplex to rule out RAS.

## 2022-09-19 NOTE — Progress Notes (Signed)
Established Patient Office Visit  Subjective   Patient ID: Jill Jordan, female    DOB: [birth year]  Age: 88 y.o. MRN: [id number]  Chief Complaint  Patient presents with   Follow-up    Patient states she went to the ER 2 days ago due to elevated blood pressure at home with reading of 184/80    Patient reports she was in the ED for the dizziness and lightheadedness. She reports that they told her that her BP was elevated. States she was told to follow up. She reports no headaches, no numbness or tingling, no difficulty with speech, no falls. BP today in office is 180/90. We discussed starting blood pressure medications and she is agreeable.     Patient Active Problem List   Diagnosis Date Noted   HTN (hypertension) 09/19/2022   Breast mass, left 09/10/2022   White coat hypertension 03/31/2013   ELEVATED BLOOD PRESSURE WITHOUT DIAGNOSIS OF HYPERTENSION 03/31/2008      Review of Systems  All other systems reviewed and are negative.     Objective:     BP (!) 180/90 (BP Location: Left Arm, Patient Position: Sitting, Cuff Size: Normal)   Pulse 63   Temp 97.7 F (36.5 C) (Oral)   Ht '5\' 6"'$  (1.676 m)   Wt 114 lb 8 oz (51.9 kg)   SpO2 97%   BMI 18.48 kg/m    Physical Exam Vitals reviewed.  Constitutional:      Appearance: Normal appearance. She is well-groomed and normal weight.  Eyes:     Conjunctiva/sclera: Conjunctivae normal.  Neck:     Thyroid: No thyromegaly.  Cardiovascular:     Rate and Rhythm: Normal rate and regular rhythm.     Pulses: Normal pulses.     Heart sounds: S1 normal and S2 normal.  Pulmonary:     Effort: Pulmonary effort is normal.     Breath sounds: Normal breath sounds and air entry.  Abdominal:     General: Bowel sounds are normal.  Musculoskeletal:     Right lower leg: No edema.     Left lower leg: No edema.  Neurological:     Mental Status: She is alert and oriented to person, place, and time. Mental status is at baseline.     Gait:  Gait is intact.  Psychiatric:        Mood and Affect: Mood and affect normal.        Speech: Speech normal.        Behavior: Behavior normal.        Judgment: Judgment normal.      No results found for any visits on 09/19/22.  Last CBC Lab Results  Component Value Date   WBC 5.5 09/17/2022   HGB 13.4 09/17/2022   HCT 43.1 09/17/2022   MCV 83.9 09/17/2022   MCH 26.1 09/17/2022   RDW 16.5 (H) 09/17/2022   PLT 292 78/67/6720   Last metabolic panel Lab Results  Component Value Date   GLUCOSE 106 (H) 09/17/2022   NA 139 09/17/2022   K 4.3 09/17/2022   CL 100 09/17/2022   CO2 27 09/17/2022   BUN 12 09/17/2022   CREATININE 0.81 09/17/2022   GFRNONAA >60 09/17/2022   CALCIUM 9.2 09/17/2022   PROT 6.2 (L) 09/17/2022   ALBUMIN 3.6 09/17/2022   BILITOT 0.7 09/17/2022   ALKPHOS 72 09/17/2022   AST 15 09/17/2022   ALT 12 09/17/2022   ANIONGAP 12 09/17/2022      The ASCVD  Risk score (Arnett DK, et al., 2019) failed to calculate for the following reasons:   The 2019 ASCVD risk score is only valid for ages 55 to 38    Assessment & Plan:   Problem List Items Addressed This Visit       Unprioritized   Breast mass, left   Relevant Orders   MM Digital Diagnostic Unilat L   HTN (hypertension) - Primary    Pt has consistently elevated blood pressures, we discussed starting amlodipine and she is agreeable. Will start 5 mg daily at bedtime and pt will continue to check her BP at home. Will RTC in 2 months for BP recheck. I will order TSH and lipid panel for risk factor assessment. I will also order renal artery duplex to rule out RAS.       Relevant Medications   amLODipine (NORVASC) 5 MG tablet   Other Relevant Orders   TSH   VAS US RENAL ARTERY DUPLEX   Other Visit Diagnoses     Lipid screening       Relevant Orders   Lipid Panel       Return in about 2 months (around 11/19/2022) for BP follow up.    Farrel Conners, MD

## 2022-09-20 ENCOUNTER — Telehealth (HOSPITAL_BASED_OUTPATIENT_CLINIC_OR_DEPARTMENT_OTHER): Payer: Self-pay | Admitting: *Deleted

## 2022-09-20 ENCOUNTER — Telehealth: Payer: Self-pay | Admitting: Family Medicine

## 2022-09-20 NOTE — Telephone Encounter (Signed)
[facility] with [facility] needs the authorization for the Renal Artery Duplex -  Please add the note into the order and she will get it from there.

## 2022-09-20 NOTE — Progress Notes (Signed)
Labs look very good, continue the blood pressure medication as prescribed.

## 2022-09-20 NOTE — Telephone Encounter (Signed)
Called and spoke with Lattie Haw at Dr. Legrand Como office to obtain insurance prior authorization for renal artery duplex

## 2022-10-09 NOTE — Telephone Encounter (Signed)
Called office and spoke with Lattie Haw regarding insurance prior authorization for the renal artery duplex ordered by Dr. Jerral Ralph will send a a high priority phone note to the provider

## 2022-10-09 NOTE — Telephone Encounter (Signed)
Jill Jordan called again to FU on this authorization request.  They have been waiting over 2 wks and really need this taken care of soon.  Please advise.  [phone]

## 2022-10-15 NOTE — Telephone Encounter (Signed)
Left message for patient to call and discuss scheduling the renal artery duplex ordered by Dr. Loralyn Freshwater

## 2022-11-07 ENCOUNTER — Ambulatory Visit (INDEPENDENT_AMBULATORY_CARE_PROVIDER_SITE_OTHER): Payer: Medicare Other

## 2022-11-07 DIAGNOSIS — I10 Essential (primary) hypertension: Secondary | ICD-10-CM

## 2022-11-07 NOTE — Progress Notes (Signed)
Kidneys show good bloodflow

## 2022-11-15 ENCOUNTER — Other Ambulatory Visit: Payer: Self-pay | Admitting: Family Medicine

## 2022-11-15 DIAGNOSIS — N6323 Unspecified lump in the left breast, lower outer quadrant: Secondary | ICD-10-CM

## 2022-11-19 ENCOUNTER — Ambulatory Visit: Payer: Medicare Other | Admitting: Family Medicine

## 2022-11-19 ENCOUNTER — Encounter: Payer: Self-pay | Admitting: Family Medicine

## 2022-11-19 VITALS — BP 150/60 | HR 78 | Temp 98.0°F | Ht 66.0 in | Wt 108.2 lb

## 2022-11-19 DIAGNOSIS — I10 Essential (primary) hypertension: Secondary | ICD-10-CM

## 2022-11-19 MED ORDER — AMLODIPINE BESYLATE 10 MG PO TABS: 10.0000 mg | ORAL_TABLET | Freq: Every day | ORAL | 1 refills | Status: DC

## 2022-11-19 NOTE — Assessment & Plan Note (Signed)
Still not at goal. I advised that she increase the amlodipine to 10 mg daily and continue to check her BP regularly. If she experiences any dizziness or lightheadedness at home with the new dose she should call the office and go back down to 5 mg daily. I will see her back in 3 months for BP recheck.

## 2022-11-19 NOTE — Progress Notes (Signed)
   Established Patient Office Visit  Subjective   Patient ID: Jill Jordan, female    DOB: January 15, 1932  Age: 89 y.o. MRN: [id number]  Chief Complaint  Patient presents with   Medical Management of Chronic Issues    Patient is here for follow up on her blood pressure. She reports she has done well on the amlodipine 5 mg daily, states that her blood pressure at home is running in the mid 140- 120's range. States that when she rechecks it  it is usually lower in the afternoon. She denies any side effects to the medication, no SOB, no swelling. We reviewed her renal artery duplex which was negative for RAS. TSH was normal, kidney function WNL.    Current Outpatient Medications  Medication Instructions   amLODipine (NORVASC) 10 mg, Oral, Daily   Multiple Vitamins-Minerals (MULTIVITAMIN GUMMIES ADULT PO) Oral, Daily   VITAMIN D PO Oral, Daily    Patient Active Problem List   Diagnosis Date Noted   HTN (hypertension) 09/19/2022   Breast mass, left 09/10/2022   White coat hypertension 03/31/2013   ELEVATED BLOOD PRESSURE WITHOUT DIAGNOSIS OF HYPERTENSION 03/31/2008      Review of Systems  All other systems reviewed and are negative.     Objective:     BP (!) 150/60 Comment: repeated by Mykal--jaf  Pulse 78   Temp 98 F (36.7 C) (Oral)   Ht '5\' 6"'$  (1.676 m)   Wt 108 lb 3.2 oz (49.1 kg)   SpO2 98%   BMI 17.46 kg/m  BP Readings from Last 3 Encounters:  11/19/22 (!) 150/60  09/19/22 (!) 180/90  09/17/22 (!) 199/86      Physical Exam Constitutional:      Appearance: Normal appearance. She is normal weight.  Cardiovascular:     Rate and Rhythm: Normal rate and regular rhythm.     Pulses: Normal pulses.     Heart sounds: Normal heart sounds. No murmur heard. Pulmonary:     Effort: Pulmonary effort is normal.     Breath sounds: Normal breath sounds. No wheezing.  Neurological:     Mental Status: She is alert and oriented to person, place, and time. Mental status is at  baseline.  Psychiatric:        Mood and Affect: Mood normal.      No results found for any visits on 11/19/22.    The ASCVD Risk score (Arnett DK, et al., 2019) failed to calculate for the following reasons:   The 2019 ASCVD risk score is only valid for ages 24 to 74    Assessment & Plan:   Problem List Items Addressed This Visit       Unprioritized   HTN (hypertension) - Primary    Still not at goal. I advised that she increase the amlodipine to 10 mg daily and continue to check her BP regularly. If she experiences any dizziness or lightheadedness at home with the new dose she should call the office and go back down to 5 mg daily. I will see her back in 3 months for BP recheck.      Relevant Medications   amLODipine (NORVASC) 10 MG tablet    Return in about 3 months (around 02/18/2023) for HTN.    Farrel Conners, MD

## 2022-11-19 NOTE — Patient Instructions (Addendum)
Increase your amlodipine to 10 mg (2 tablets of the 5 mg) once a day, it is recommended to take your BP medication in the evening.  Continue to check your blood pressure daily at home  Look out for dizziness or lightheadedness with this increase-- if you experience these symptoms please call the office and go back down to the 5 mg tablet once daily.

## 2022-11-25 ENCOUNTER — Ambulatory Visit
Admission: RE | Admit: 2022-11-25 | Discharge: 2022-11-25 | Disposition: A | Discharge status: Home / Self Care | Payer: Medicare Other | Source: Ambulatory Visit | Attending: Family Medicine | Admitting: Family Medicine

## 2022-11-25 ENCOUNTER — Other Ambulatory Visit: Payer: Self-pay | Admitting: Family Medicine

## 2022-11-25 DIAGNOSIS — N6323 Unspecified lump in the left breast, lower outer quadrant: Secondary | ICD-10-CM

## 2022-12-02 ENCOUNTER — Ambulatory Visit
Admission: RE | Admit: 2022-12-02 | Discharge: 2022-12-02 | Disposition: A | Discharge status: Home / Self Care | Payer: Medicare Other | Source: Ambulatory Visit | Attending: Family Medicine | Admitting: Family Medicine

## 2022-12-02 DIAGNOSIS — N6323 Unspecified lump in the left breast, lower outer quadrant: Secondary | ICD-10-CM

## 2022-12-02 DIAGNOSIS — N6321 Unspecified lump in the left breast, upper outer quadrant: Secondary | ICD-10-CM

## 2022-12-02 HISTORY — PX: BREAST BIOPSY: SHX20

## 2022-12-04 ENCOUNTER — Telehealth: Payer: Self-pay | Admitting: Hematology and Oncology

## 2022-12-04 NOTE — Telephone Encounter (Signed)
Spoke to patients son to confirm appointment, gave location and time and told him I would mail a packet that I needed her to fill out and bring when she came for the appointment

## 2022-12-09 ENCOUNTER — Encounter: Payer: Self-pay | Admitting: *Deleted

## 2022-12-09 DIAGNOSIS — Z170 Estrogen receptor positive status [ER+]: Secondary | ICD-10-CM

## 2022-12-09 DIAGNOSIS — C50412 Malignant neoplasm of upper-outer quadrant of left female breast: Secondary | ICD-10-CM | POA: Insufficient documentation

## 2022-12-10 NOTE — Progress Notes (Signed)
Radiation Oncology         [phone] ________________________________  Initial Outpatient Consultation  Name: Jill Jordan MRN: [id number]  Date: 12/11/2022  DOB: Dec 02, 1931  [id number], Royston Cowper, MD  Coralie Keens, MD   REFERRING PHYSICIAN: Coralie Keens, MD  DIAGNOSIS: No diagnosis found.   Cancer Staging  No matching staging information was found for the patient.  Stage *** Left Breast UOQ, Invasive and in situ ductal carcinoma, ER+ / PR- / Her2-, Grade 2  CHIEF COMPLAINT: Here to discuss management of left breast cancer  HISTORY OF PRESENT ILLNESS::Jill Jordan is a 89 y.o. female who presented with a palpable left breast lump prompting a diagnostic bilateral breast mammogram and left breast ultrasound on 11/25/22 which demonstrated an irregular, hypoechoic mass in the 2 o'clock left breast measuring 2.8 x 2.0 x 1.3 cm, located 3 cmfn, with mild associated vascularity. Diagnostic imaging otherwise showed no suspicious left axillary lymphadenopathy or evidence of malignancy in the right breast.  Biopsy of the 2 o'clock left breast on date of 12/02/22 showed grade 2 invasive ductal carcinoma measuring 1.1 cm in the greatest linear extent of the sample with intermediate grade DCIS.  ER status: 100% positive with strong staining intensity; PR status 0% negative; Proliferation marker Ki67 at 10%; Her2 status negative; Grade 2. No lymph nodes were examined.   ***  PREVIOUS RADIATION THERAPY: No  PAST MEDICAL HISTORY:  has a past medical history of Chicken pox.    PAST SURGICAL HISTORY: Past Surgical History:  Procedure Laterality Date   BREAST BIOPSY Left 12/02/2022   [place] LT BREAST BX W LOC DEV 1ST LESION IMG BX SPEC US GUIDE 12/02/2022 [facility]   BREAST SURGERY Left 1962   removal of small tumor left breast  1962    FAMILY HISTORY: family history includes Alcohol abuse in an other family member; COPD in her father; Healthy in her brother; Heart disease in  her brother; Hypertension (age of onset: 35) in her mother; Kidney disease in an other family member.  SOCIAL HISTORY:  reports that she has never smoked. She has never used smokeless tobacco. She reports that she does not drink alcohol and does not use drugs.  ALLERGIES: Patient has no known allergies.  MEDICATIONS:  Current Outpatient Medications  Medication Sig Dispense Refill   amLODipine (NORVASC) 10 MG tablet Take 1 tablet (10 mg total) by mouth daily. 90 tablet 1   Multiple Vitamins-Minerals (MULTIVITAMIN GUMMIES ADULT PO) Take by mouth daily.     VITAMIN D PO Take by mouth daily.     No current facility-administered medications for this encounter.    REVIEW OF SYSTEMS: As above in HPI.   PHYSICAL EXAM:  vitals were not taken for this visit.   General: Alert and oriented, in no acute distress HEENT: Head is normocephalic. Extraocular movements are intact. Oropharynx is clear. Neck: Neck is supple, no palpable cervical or supraclavicular lymphadenopathy. Heart: Regular in rate and rhythm with no murmurs, rubs, or gallops. Chest: Clear to auscultation bilaterally, with no rhonchi, wheezes, or rales. Abdomen: Soft, nontender, nondistended, with no rigidity or guarding. Extremities: No cyanosis or edema. Lymphatics: see Neck Exam Skin: No concerning lesions. Musculoskeletal: symmetric strength and muscle tone throughout. Neurologic: Cranial nerves II through XII are grossly intact. No obvious focalities. Speech is fluent. Coordination is intact. Psychiatric: Judgment and insight are intact. Affect is appropriate. Breasts: *** . No other palpable masses appreciated in the breasts or axillae *** .    ECOG = ***  0 - Asymptomatic (Fully active, able to carry on all predisease activities without restriction)  1 - Symptomatic but completely ambulatory (Restricted in physically strenuous activity but ambulatory and able to carry out work of a light or sedentary nature. For  example, light housework, office work)  2 - Symptomatic, <50% in bed during the day (Ambulatory and capable of all self care but unable to carry out any work activities. Up and about more than 50% of waking hours)  3 - Symptomatic, >50% in bed, but not bedbound (Capable of only limited self-care, confined to bed or chair 50% or more of waking hours)  4 - Bedbound (Completely disabled. Cannot carry on any self-care. Totally confined to bed or chair)  5 - Death   Eustace Pen MM, Creech RH, Tormey DC, et al. [phone]). "Toxicity and response criteria of the [facility]". [place] Oncol. 5 (6): 649-55   LABORATORY DATA:  Lab Results  Component Value Date   WBC 5.5 09/17/2022   HGB 13.4 09/17/2022   HCT 43.1 09/17/2022   MCV 83.9 09/17/2022   PLT 292 09/17/2022   CMP     Component Value Date/Time   NA 139 09/17/2022 1058   K 4.3 09/17/2022 1058   CL 100 09/17/2022 1058   CO2 27 09/17/2022 1058   GLUCOSE 106 (H) 09/17/2022 1058   BUN 12 09/17/2022 1058   CREATININE 0.81 09/17/2022 1058   CALCIUM 9.2 09/17/2022 1058   PROT 6.2 (L) 09/17/2022 1058   ALBUMIN 3.6 09/17/2022 1058   AST 15 09/17/2022 1058   ALT 12 09/17/2022 1058   ALKPHOS 72 09/17/2022 1058   BILITOT 0.7 09/17/2022 1058   GFRNONAA >60 09/17/2022 1058   GFRAA  03/30/2008 1008    >60        The eGFR has been calculated using the MDRD equation. This calculation has not been validated in all clinical         RADIOGRAPHY: [place] LT BREAST BX W LOC DEV 1ST LESION IMG BX SPEC US GUIDE  Addendum Date: 12/03/2022   ADDENDUM REPORT: 12/03/2022 14:11 ADDENDUM: Pathology revealed GRADE II INVASIVE DUCTAL CARCINOMA WITH MUCINOUS FEATURES, DUCTAL CARCINOMA IN SITU, INTERMEDIATE GRADE of the LEFT breast, 2 o'clock, (heart clip x 2). This was found to be concordant by Dr. Franki Cabot. Pathology results were discussed with the patient by telephone. The patient reported doing well after the biopsy with tenderness  at the site. Post biopsy instructions and care were reviewed and questions were answered. The patient was encouraged to call [facility] for any additional concerns. My direct phone number was provided. The patient was referred to [facility] on December 11, 2022. Pathology results reported by Terie Purser, RN on 12/03/2022. Electronically Signed   By: Franki Cabot M.D.   On: 12/03/2022 14:11   Result Date: 12/03/2022 CLINICAL DATA:  Patient with a LEFT breast mass presents today for ultrasound-guided core biopsy. EXAM: ULTRASOUND GUIDED LEFT BREAST CORE NEEDLE BIOPSY COMPARISON:  Previous exam(s). PROCEDURE: I met with the patient and we discussed the procedure of ultrasound-guided biopsy, including benefits and alternatives. We discussed the high likelihood of a successful procedure. We discussed the risks of the procedure, including infection, bleeding, tissue injury, clip migration, and inadequate sampling. Informed written consent was given. The usual time-out protocol was performed immediately prior to the procedure. Lesion quadrant: Upper outer quadrant Using sterile technique and  1% Lidocaine as local anesthetic, under direct ultrasound visualization, a 12 gauge spring-loaded device was used to perform biopsy of the LEFT breast mass at the 2 o'clock axis using a lateral approach. At the conclusion of the procedure , a heart shaped tissue marker clip was deployed into the biopsy cavity. It was unclear if the heart shaped tissue marker clip deployed, therefore, a second heart shaped tissue marker clip was deployed into the biopsy cavity. Follow up 2 view mammogram was performed and dictated separately. IMPRESSION: 1. Ultrasound guided biopsy of the LEFT breast mass at the 2 o'clock axis. No apparent complications. 2. At the conclusion of the procedure , a heart shaped tissue marker clip was deployed into  the biopsy cavity. However, it was initially unclear if the heart shaped tissue marker clip deployed correctly, therefore, a second heart shaped tissue marker clip was deployed into the same biopsy cavity. Electronically Signed: By: Franki Cabot M.D. On: 12/02/2022 14:25  MM CLIP PLACEMENT LEFT  Result Date: 12/02/2022 CLINICAL DATA:  Status post ultrasound-guided biopsy of a LEFT breast mass at 2 o'clock axis. EXAM: 3D DIAGNOSTIC LEFT MAMMOGRAM POST ULTRASOUND BIOPSY COMPARISON:  Previous exam(s). FINDINGS: 3D Mammographic images were obtained following ultrasound guided biopsy of the LEFT breast mass at the 2 o'clock axis. Heart shaped clip was placed at the biopsy site. However, it was initially unclear if the biopsy clip deployed correctly so a second heart shaped clip was placed at the same biopsy site. The biopsy marking clips are in expected position at the single site of biopsy. IMPRESSION: Heart shaped clip was placed at the biopsy site. However, it was initially unclear if the biopsy clip deployed correctly so a second heart shaped clip was placed at the same biopsy site. The 2 heart shaped biopsy marking clips are in expected position at the single site of biopsy. Final Assessment: Post Procedure Mammograms for Marker Placement Electronically Signed   By: Franki Cabot M.D.   On: 12/02/2022 14:41  MM DIAG BREAST TOMO BILATERAL  Result Date: 11/25/2022 CLINICAL DATA:  89 year old female with a palpable left breast lump. EXAM: DIGITAL DIAGNOSTIC BILATERAL MAMMOGRAM WITH TOMOSYNTHESIS; ULTRASOUND LEFT BREAST LIMITED TECHNIQUE: Bilateral digital diagnostic mammography and breast tomosynthesis was performed.; Targeted ultrasound examination of the left breast was performed. COMPARISON:  None available. ACR Breast Density Category b: There are scattered areas of fibroglandular density. FINDINGS: A radiopaque BB was placed at the site of the patient's palpable lump in the upper-outer left breast. An  irregular, spiculated hyperdense mass is seen deep to the radiopaque BB. A questioned asymmetry in the medial left breast on the CC projection resolves on additional spot views. There are no other suspicious findings in the remainder of either breast. Targeted ultrasound is performed, showing an irregular, hypoechoic mass at the 2 o'clock position 3 cm from the nipple on the left. It measures 2.8 x 2.0 x 1.3 cm with mild associated vascularity. This likely corresponds with the mammographic finding. Evaluation of the left axilla demonstrates no suspicious lymphadenopathy. IMPRESSION: 1. Highly suspicious left breast mass corresponding with the patient's palpable lump. Recommend ultrasound-guided biopsy. 2. No suspicious left axillary lymphadenopathy. 3. No mammographic evidence of malignancy on the right. RECOMMENDATION: Ultrasound-guided biopsy of the left breast. I have discussed the findings and recommendations with the patient. If applicable, a reminder letter will be sent to the patient regarding the next appointment. BI-RADS CATEGORY  5: Highly suggestive of malignancy. Electronically Signed   By: Shayne Alken.D.  On: 11/25/2022 13:42  US BREAST LTD UNI LEFT INC AXILLA  Result Date: 11/25/2022 CLINICAL DATA:  89 year old female with a palpable left breast lump. EXAM: DIGITAL DIAGNOSTIC BILATERAL MAMMOGRAM WITH TOMOSYNTHESIS; ULTRASOUND LEFT BREAST LIMITED TECHNIQUE: Bilateral digital diagnostic mammography and breast tomosynthesis was performed.; Targeted ultrasound examination of the left breast was performed. COMPARISON:  None available. ACR Breast Density Category b: There are scattered areas of fibroglandular density. FINDINGS: A radiopaque BB was placed at the site of the patient's palpable lump in the upper-outer left breast. An irregular, spiculated hyperdense mass is seen deep to the radiopaque BB. A questioned asymmetry in the medial left breast on the CC projection resolves on additional spot  views. There are no other suspicious findings in the remainder of either breast. Targeted ultrasound is performed, showing an irregular, hypoechoic mass at the 2 o'clock position 3 cm from the nipple on the left. It measures 2.8 x 2.0 x 1.3 cm with mild associated vascularity. This likely corresponds with the mammographic finding. Evaluation of the left axilla demonstrates no suspicious lymphadenopathy. IMPRESSION: 1. Highly suspicious left breast mass corresponding with the patient's palpable lump. Recommend ultrasound-guided biopsy. 2. No suspicious left axillary lymphadenopathy. 3. No mammographic evidence of malignancy on the right. RECOMMENDATION: Ultrasound-guided biopsy of the left breast. I have discussed the findings and recommendations with the patient. If applicable, a reminder letter will be sent to the patient regarding the next appointment. BI-RADS CATEGORY  5: Highly suggestive of malignancy. Electronically Signed   By: Kristopher Oppenheim M.D.   On: 11/25/2022 13:42     IMPRESSION/PLAN: ***   It was a pleasure meeting the patient today. We discussed the risks, benefits, and side effects of radiotherapy. I recommend radiotherapy to the *** to reduce her risk of locoregional recurrence by 2/3.  We discussed that radiation would take approximately *** weeks to complete and that I would give the patient a few weeks to heal following surgery before starting treatment planning. *** If chemotherapy were to be given, this would precede radiotherapy. We spoke about acute effects including skin irritation and fatigue as well as much less common late effects including internal organ injury or irritation. We spoke about the latest technology that is used to minimize the risk of late effects for patients undergoing radiotherapy to the breast or chest wall. No guarantees of treatment were given. The patient is enthusiastic about proceeding with treatment. I look forward to participating in the patient's care.  I  will await her referral back to me for postoperative follow-up and eventual CT simulation/treatment planning.  On date of service, in total, I spent *** minutes on this encounter. Patient was seen in person.   __________________________________________   Eppie Gibson, MD  This document serves as a record of services personally performed by Eppie Gibson, MD. It was created on her behalf by Roney Mans, a trained medical scribe. The creation of this record is based on the scribe's personal observations and the provider's statements to them. This document has been checked and approved by the attending provider.

## 2022-12-11 ENCOUNTER — Inpatient Hospital Stay: Payer: Medicare Other | Attending: Hematology and Oncology | Admitting: Hematology and Oncology

## 2022-12-11 ENCOUNTER — Other Ambulatory Visit: Payer: Self-pay | Admitting: Surgery

## 2022-12-11 ENCOUNTER — Telehealth: Payer: Self-pay | Admitting: Genetic Counselor

## 2022-12-11 ENCOUNTER — Encounter: Payer: Self-pay | Admitting: *Deleted

## 2022-12-11 ENCOUNTER — Encounter: Payer: Self-pay | Admitting: Radiation Oncology

## 2022-12-11 ENCOUNTER — Inpatient Hospital Stay: Payer: Medicare Other

## 2022-12-11 ENCOUNTER — Ambulatory Visit: Payer: Medicare Other | Admitting: Physical Therapy

## 2022-12-11 ENCOUNTER — Ambulatory Visit
Admission: RE | Admit: 2022-12-11 | Discharge: 2022-12-11 | Disposition: A | Discharge status: Home / Self Care | Payer: Medicare Other | Source: Ambulatory Visit | Attending: Radiation Oncology | Admitting: Radiation Oncology

## 2022-12-11 VITALS — BP 162/73 | HR 92 | Temp 98.0°F | Resp 18 | Ht 66.0 in | Wt 112.0 lb

## 2022-12-11 DIAGNOSIS — Z170 Estrogen receptor positive status [ER+]: Secondary | ICD-10-CM

## 2022-12-11 DIAGNOSIS — C50412 Malignant neoplasm of upper-outer quadrant of left female breast: Secondary | ICD-10-CM | POA: Insufficient documentation

## 2022-12-11 LAB — CMP (CANCER CENTER ONLY)
ALT: 6 U/L (ref 0–44)
AST: 10 U/L — ABNORMAL LOW (ref 15–41)
Albumin: 3.9 g/dL (ref 3.5–5.0)
Alkaline Phosphatase: 84 U/L (ref 38–126)
Anion gap: 6 (ref 5–15)
BUN: 14 mg/dL (ref 8–23)
CO2: 28 mmol/L (ref 22–32)
Calcium: 8.8 mg/dL — ABNORMAL LOW (ref 8.9–10.3)
Chloride: 102 mmol/L (ref 98–111)
Creatinine: 0.72 mg/dL (ref 0.44–1.00)
GFR, Estimated: >60 mL/min (ref >60)
Glucose, Bld: 170 mg/dL — ABNORMAL HIGH (ref 70–99)
Potassium: 3.6 mmol/L (ref 3.5–5.1)
Sodium: 136 mmol/L (ref 135–145)
Total Bilirubin: 0.5 mg/dL (ref 0.3–1.2)
Total Protein: 6.5 g/dL (ref 6.5–8.1)

## 2022-12-11 LAB — CBC WITH DIFFERENTIAL (CANCER CENTER ONLY)
Abs Immature Granulocytes: 0.03 10*3/uL (ref 0.00–0.07)
Basophils Absolute: 0.1 10*3/uL (ref 0.0–0.1)
Basophils Relative: 1 %
Eosinophils Absolute: 0.1 10*3/uL (ref 0.0–0.5)
Eosinophils Relative: 1 %
HCT: 42.6 % (ref 36.0–46.0)
Hemoglobin: 13.9 g/dL (ref 12.0–15.0)
Immature Granulocytes: 1 %
Lymphocytes Relative: 18 %
Lymphs Abs: 1.1 10*3/uL (ref 0.7–4.0)
MCH: 28.1 pg (ref 26.0–34.0)
MCHC: 32.6 g/dL (ref 30.0–36.0)
MCV: 86.1 fL (ref 80.0–100.0)
Monocytes Absolute: 0.6 10*3/uL (ref 0.1–1.0)
Monocytes Relative: 10 %
Neutro Abs: 4.3 10*3/uL (ref 1.7–7.7)
Neutrophils Relative %: 69 %
Platelet Count: 394 10*3/uL (ref 150–400)
RBC: 4.95 MIL/uL (ref 3.87–5.11)
RDW: 15.8 % — ABNORMAL HIGH (ref 11.5–15.5)
WBC Count: 6.2 10*3/uL (ref 4.0–10.5)
nRBC: 0 % (ref 0.0–0.2)

## 2022-12-11 LAB — GENETIC SCREENING ORDER

## 2022-12-11 NOTE — Research (Signed)
Trial:  [employer] 2021-05 - Specimen Collection Study to Evaluate Biomarkers in Subjects with Cancer   Patient Jill Jordan was identified by Dr. Lindi Adie as a potential candidate for the above listed study.  This Clinical Research Nurse met with Serina Cowper, [id number], on 12/11/22 in a manner and location that ensures patient privacy to discuss participation in the above listed research study.  Patient is Accompanied by her son .  A copy of the informed consent document with embedded HIPAA language was provided to the patient.  Patient reads, speaks, and understands [place].   Patient was provided with the business card of this Nurse and encouraged to contact the research team with any questions.  Approximately 5 minutes were spent with the patient reviewing the informed consent documents.  Patient was provided the option of taking informed consent documents home to review and was encouraged to review at their convenience with their support network, including other care providers. Patient took the consent documents home to review. Patient states she will call if she is interested in participating before her surgery date.  Foye Spurling, BSN, RN, [place] Nurse II [phone] 12/11/2022 2:58 PM

## 2022-12-11 NOTE — Assessment & Plan Note (Signed)
12/02/2022:Palpable left breast lump at 2 o'clock position measuring 2.8 cm axilla negative biopsy revealed grade 2 IDC with mucinous features ER 100%, PR 0%, Ki-67 10%, HER2 negative  Pathology and radiology counseling: Discussed with the patient, the details of pathology including the type of breast cancer,the clinical staging, the significance of ER, PR and HER-2/neu receptors and the implications for treatment. After reviewing the pathology in detail, we proceeded to discuss the different treatment options between surgery, radiation, chemotherapy, antiestrogen therapies.  Treatment plan: Breast conserving surgery +/- Adjuvant radiation Adjuvant antiestrogen therapy  Return to clinic after surgery to discuss final pathology report

## 2022-12-11 NOTE — Progress Notes (Signed)
[place] NOTE  Patient Care Team: Farrel Conners, MD as PCP - General (Family Medicine) Coralie Keens, MD as Consulting Physician (General Surgery) Nicholas Lose, MD as Consulting Physician (Hematology and Oncology) Eppie Gibson, MD as Attending Physician (Radiation Oncology) Rockwell Germany, RN as Oncology Nurse Navigator Mauro Kaufmann, RN as Oncology Nurse Navigator  CHIEF COMPLAINTS/PURPOSE OF CONSULTATION:  Newly diagnosed breast cancer  HISTORY OF PRESENTING ILLNESS:  Jill Jordan 89 y.o. female is here because of recent diagnosis of left breast cancer.  Patient had a palpable lump in the left breast at 2 o'clock position measuring 2.8 cm.  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  She was presented this morning in the multidisciplinary tumor board and she is here today accompanied by her daughter and her husband to discuss treatment plan.  I reviewed her records extensively and collaborated the history with the patient.  SUMMARY OF ONCOLOGIC HISTORY: Oncology History  Malignant neoplasm of upper-outer quadrant of left breast in female, estrogen receptor positive ([place])  12/02/2022 Initial Diagnosis   Palpable left breast lump at 2 o'clock position measuring 2.8 cm axilla negative biopsy revealed grade 2 IDC with mucinous features ER 100%, PR 0%, Ki-67 10%, HER2 negative   12/11/2022 Cancer Staging   Staging form: Breast, AJCC 8th Edition - Clinical: Stage IIA (cT2, cN0, cM0, G2, ER+, PR-, HER2-) - Signed by Nicholas Lose, MD on 12/11/2022 Histologic grading system: 3 grade system      MEDICAL HISTORY:  Past Medical History:  Diagnosis Date   Chicken pox     SURGICAL HISTORY: Past Surgical History:  Procedure Laterality Date   BREAST BIOPSY Left 12/02/2022   [place] LT BREAST BX W LOC DEV 1ST  LESION IMG BX SPEC US GUIDE 12/02/2022 [facility] MAMMOGRAPHY   BREAST SURGERY Left 1962   removal of small tumor left breast  1962    SOCIAL HISTORY: Social History   Socioeconomic History   Marital status: Widowed    Spouse name: Not on file   Number of children: Not on file   Years of education: Not on file   Highest education level: Not on file  Occupational History   Not on file  Tobacco Use   Smoking status: Never   Smokeless tobacco: Never  Vaping Use   Vaping Use: Never used  Substance and Sexual Activity   Alcohol use: No   Drug use: No   Sexual activity: Not on file  Other Topics Concern   Not on file  Social History Narrative   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[employer], now Micro[employer]      Home Situation: has lived in [place] all her life, lives with son      Spiritual Beliefs: baptist      Lifestyle: walks 1 hour per day, also mows the the lawn and takes care of the chickens, eats healthy               Social Determinants of Health   Financial Resource Strain: Not on file  Food Insecurity: Not on file  Transportation Needs: Not on file  Physical Activity: Not on file  Stress: Not on file  Social Connections: Not on file  Intimate Partner Violence: Not on file    FAMILY HISTORY: Family History  Problem Relation Age of Onset   Alcohol  abuse Other    Kidney disease Other    Hypertension Mother 52       lived to be 72   COPD Father    Heart disease Brother    Healthy Brother    Diabetes Neg Hx    Cancer Neg Hx     ALLERGIES:  has No Known Allergies.  MEDICATIONS:  Current Outpatient Medications  Medication Sig Dispense Refill   amLODipine (NORVASC) 10 MG tablet Take 1 tablet (10 mg total) by mouth daily. 90 tablet 1   Multiple Vitamins-Minerals (MULTIVITAMIN GUMMIES ADULT PO) Take by mouth daily.     VITAMIN D PO Take by mouth daily.     No current facility-administered medications for this visit.    REVIEW OF SYSTEMS:    Constitutional: Denies fevers, chills or abnormal night sweats   All other systems were reviewed with the patient and are negative.  PHYSICAL EXAMINATION: ECOG PERFORMANCE STATUS: 1 - Symptomatic but completely ambulatory  Vitals:   12/11/22 1254  BP: (!) 162/73  Pulse: 92  Resp: 18  Temp: 98 F (36.7 C)  SpO2: 100%   Filed Weights   12/11/22 1254  Weight: 112 lb (50.8 kg)    GENERAL:alert, no distress and comfortable    LABORATORY DATA:  I have reviewed the data as listed Lab Results  Component Value Date   WBC 6.2 12/11/2022   HGB 13.9 12/11/2022   HCT 42.6 12/11/2022   MCV 86.1 12/11/2022   PLT 394 12/11/2022   Lab Results  Component Value Date   NA 136 12/11/2022   K 3.6 12/11/2022   CL 102 12/11/2022   CO2 28 12/11/2022    RADIOGRAPHIC STUDIES: I have personally reviewed the radiological reports and agreed with the findings in the report.  ASSESSMENT AND PLAN:  Malignant neoplasm of upper-outer quadrant of left breast in female, estrogen receptor positive ([place]) 12/02/2022:Palpable left breast lump at 2 o'clock position measuring 2.8 cm axilla negative biopsy revealed grade 2 IDC with mucinous features ER 100%, PR 0%, Ki-67 10%, HER2 negative  Pathology and radiology counseling: Discussed with the patient, the details of pathology including the type of breast cancer,the clinical staging, the significance of ER, PR and HER-2/neu receptors and the implications for treatment. After reviewing the pathology in detail, we proceeded to discuss the different treatment options between surgery, radiation, chemotherapy, antiestrogen therapies.  Treatment plan: Breast conserving surgery +/- Adjuvant radiation (possible ultra fractionated radiation) Adjuvant antiestrogen therapy  Return to clinic after surgery to discuss final pathology report   All questions were answered. The patient knows to call the clinic with any problems, questions or concerns.    Harriette Ohara, MD 12/11/22

## 2022-12-11 NOTE — Telephone Encounter (Signed)
Ms. Jill Jordan was seen by a genetic counselor during the breast multidisciplinary clinic on 12/11/2022. In addition to her personal history of breast cancer, she reported no family history of cancer. She does not meet NCCN criteria for genetic testing at this time. She was still offered genetic counseling and testing but declined. We encourage her to contact us if there are any changes to her personal or family history of cancer. If she meets NCCN criteria based on the updated personal/family history, she would be recommended to have genetic counseling and testing.   Lucille Passy, MS, [facility] Genetic Counselor [email or web address] (P) [phone]

## 2022-12-16 ENCOUNTER — Encounter: Payer: Self-pay | Admitting: *Deleted

## 2022-12-16 ENCOUNTER — Telehealth: Payer: Self-pay | Admitting: *Deleted

## 2022-12-16 ENCOUNTER — Telehealth: Payer: Self-pay | Admitting: Hematology and Oncology

## 2022-12-16 NOTE — Telephone Encounter (Signed)
Spoke with pt concerning [place] from 2.21.24. Denies questions or concerns regarding dx or treatment care plan. Encourage pt to call with needs. Received verbal understanding.

## 2022-12-16 NOTE — Telephone Encounter (Signed)
Per 2/26 IB scheduled patient. Patient aware of date and time of appointment.

## 2022-12-17 ENCOUNTER — Telehealth (INDEPENDENT_AMBULATORY_CARE_PROVIDER_SITE_OTHER): Payer: Medicare Other | Admitting: Family Medicine

## 2022-12-17 DIAGNOSIS — Z0000 Encounter for general adult medical examination without abnormal findings: Secondary | ICD-10-CM

## 2022-12-17 NOTE — Progress Notes (Signed)
MEDICARE WELLNESS VISIT PATIENT CHECK-IN and HEALTH RISK ASSESSMENT QUESTIONNAIRE:  -completed by phone/video for upcoming Medicare Preventive Visit  Pre-Visit Check-in: 1)Vitals (height, wt, BP, etc) - record in vitals section for visit on day of visit 2)Review and Update Medications, Allergies PMH, Surgeries, Social history in Epic 3)Hospitalizations in the last year with date/reason? no  4)Review and Update Care Team (patient's specialists) in Epic 5) Complete PHQ9 in Epic  6) Complete Fall Screening in Epic 7)Review all Health Maintenance Due and order under PCP if not done.  8)Medicare Wellness Questionnaire: Answer theses question about your habits: Do you drink alcohol? no How many drinks do you have a day?n/a Have you ever smoked?no Have you stopped smoking and date if applicable? N/a  How many packs a day do you smoke? N/a Do you use smokeless tobacco?no Do you use illicit drugs?no Do you exercises? yesIf so, what type and how many days/minutes per week?walking every day 15-20 mins. Exercise on arms and legs. She continued working until last year and is very active. Are you sexually active? No Number of partners? She loves fruits and veggies - they grow a lot of their own veggies in her garden. What did you eat for breakfast today (or yesterday)? Typical breakfast: egg, grits, toast, oatmeal What did you eat for lunch today (or yesterday)?  Typical lunch: beef stew, [place] sandwich, beans, cornbread.  What did you eat for diner today (or yesterday)? Typical dinner: chicken, fish, hamburger, vegetables. Typical snacks:peanuts, peanut butter cracker, fruits What beverages do you drink besides water:water, dr. Malachi Bonds, coffee  Answer theses question about you: Can you perform most household chores?yes Do you find it hard to follow a conversation in a noisy room?no Do you find it hard to understand a speaker at church or in a meeting?no Do you often ask people to speak up or  repeat themselves?very often  Do you experience ringing in your ears?no Do you have difficulty understanding a soft or whispered voice?no Do you feel that you have a problem with memory?no Do you often misplace items?very seldom Do you balance your checkbook and or bank acounts?yes Do you feel safe at home?yes Last dentist visit? 3 years ago Do you need assistance with any of the following:  Driving?no  Feeding yourself?no  Getting from bed to chair?no  Getting to the toilet?no  Bathing or showering?no  Dressing yourself?no  Managing money?no  Climbing a flight of stairs?no  Preparing meals?no  Do you have Advanced Directives in place (Living Will, Healthcare Power or Attorney)? No   Last eye Exam and location?about 4 years ago   Do you currently use prescribed or non-prescribed narcotic or opioid pain medications?no  Do you have a history or close family history of breast, ovarian, tubal or peritoneal cancer or a family member with BRCA 1/2 (breast cancer susceptibility 1 and 2) gene mutations? Brother had skin cancer. Pt reports she recently diagnosed with breast cancer.   Nurse/Assistant Credentials/time stamp:[name]/CMA/2:41pm   ----------------------------------------------------------------------------------------------------------------------------------------------------------------------------------------------------------------------   MEDICARE ANNUAL PREVENTIVE VISIT WITH PROVIDER: (Welcome to [employer], initial annual wellness or annual wellness exam)  Virtual Visit via Phone Note  I connected with Jill Jordan on 12/17/22 by phone and verified that I am speaking with the correct person using two identifiers.  Location patient: home Location provider:work or home office Persons participating in the virtual visit: patient, provider  Concerns and/or follow up today: none new - getting lumpectomy for breast lump. Reports is contained and was told they will  be able to get it all out.    See HM section in Epic for other details of completed HM.    ROS: negative for report of fevers, unintentional weight loss, vision changes, vision loss, hearing loss or change, chest pain, sob, hemoptysis, melena, hematochezia, hematuria, genital discharge or lesions, falls, bleeding or bruising, loc, thoughts of suicide or self harm, memory loss  Patient-completed extensive health risk assessment - reviewed and discussed with the patient: See Health Risk Assessment completed with patient prior to the visit either above or in recent phone note. This was reviewed in detailed with the patient today and appropriate recommendations, orders and referrals were placed as needed per Summary below and patient instructions.   Review of Medical History: -PMH, [place], Family History and current specialty and care providers reviewed and updated and listed below   Patient Care Team: Farrel Conners, MD as PCP - General (Family Medicine) Coralie Keens, MD as Consulting Physician (General Surgery) Nicholas Lose, MD as Consulting Physician (Hematology and Oncology) Eppie Gibson, MD as Attending Physician (Radiation Oncology) Rockwell Germany, RN as Oncology Nurse Navigator Mauro Kaufmann, RN as Oncology Nurse Navigator   Past Medical History:  Diagnosis Date   Chicken pox     Past Surgical History:  Procedure Laterality Date   BREAST BIOPSY Left 12/02/2022   [place] LT BREAST BX W LOC DEV 1ST LESION IMG BX [place] US GUIDE 12/02/2022 [facility]AMMOGRAPHY   BREAST SURGERY Left 1962   removal of small tumor left breast  1962    Social History   Socioeconomic History   Marital status: Widowed    Spouse name: Not on file   Number of children: Not on file   Years of education: Not on file   Highest education level: Not on file  Occupational History   Not on file  Tobacco Use   Smoking status: Never   Smokeless tobacco: Never  Vaping Use   Vaping Use: Never used   Substance and Sexual Activity   Alcohol use: No   Drug use: No   Sexual activity: Not on file  Other Topics Concern   Not on file  Social History Narrative   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Actuary, now Microbiologist houses      Home Situation: has lived in [place] all her life, lives with son      Spiritual Beliefs: baptist      Lifestyle: walks 1 hour per day, also mows the the lawn and takes care of the chickens, eats healthy               Social Determinants of Health   Financial Resource Strain: Not on file  Food Insecurity: Not on file  Transportation Needs: Not on file  Physical Activity: Not on file  Stress: Not on file  Social Connections: Not on file  Intimate Partner Violence: Not on file    Family History  Problem Relation Age of Onset   Alcohol abuse Other    Kidney disease Other    Hypertension Mother 13       lived to be 25   COPD Father    Heart disease Brother    Healthy Brother    Diabetes Neg Hx    Cancer Neg Hx     Current Outpatient Medications on File Prior to Visit  Medication Sig Dispense Refill   amLODipine (NORVASC) 10 MG tablet Take 1 tablet (10 mg total) by mouth daily. 90 tablet 1  Cholecalciferol (VITAMIN D3) POWD Take by mouth.     Multiple Vitamins-Minerals (MULTIVITAMIN GUMMIES ADULT PO) Take by mouth daily.     VITAMIN D PO Take by mouth daily.     No current facility-administered medications on file prior to visit.    No Known Allergies     Physical Exam There were no vitals filed for this visit. Estimated body mass index is 18.08 kg/m as calculated from the following:   Height as of 12/11/22: '5\' 6"'$  (1.676 m).   Weight as of 12/11/22: 112 lb (50.8 kg).  EKG (optional): deferred due to virtual visit  GENERAL: alert, oriented, no acute distress detected, full vision exam deferred due to pandemic and/or virtual encounter  PSYCH/NEURO: pleasant and cooperative, no obvious depression or anxiety, speech and thought  processing grossly intact, Cognitive function grossly intact  Flowsheet Row Video Visit from 12/17/2022 in [place] [facility]  PHQ-9 Total Score 0           12/17/2022    2:28 PM 09/10/2022   11:24 AM 04/17/2020   12:46 PM 09/27/2013    9:50 AM  Depression screen PHQ 2/9  Decreased Interest 0 0 0 0  Down, Depressed, Hopeless 0 0 0 0  PHQ - 2 Score 0 0 0 0  Altered sleeping 0 0    Tired, decreased energy 0 0    Change in appetite 0 0    Feeling bad or failure about yourself  0 0    Trouble concentrating 0 0    Moving slowly or fidgety/restless 0 0    Suicidal thoughts 0 0    PHQ-9 Score 0 0    Difficult doing work/chores Not difficult at all          09/27/2013    9:50 AM 09/10/2022   11:27 AM 09/17/2022   10:45 AM 09/17/2022    3:58 PM 11/19/2022   10:49 AM  Fall Risk  Falls in the past year? No 0   0  Was there an injury with Fall?  0   0  Fall Risk Category Calculator  0   0  Fall Risk Category (Retired)  Low     (RETIRED) Patient Fall Risk Level  Low fall risk Low fall risk Low fall risk   Patient at Risk for Falls Due to  No Fall Risks   No Fall Risks  Fall risk Follow up  Falls evaluation completed        SUMMARY AND PLAN:  Encounter for Medicare annual wellness exam    Discussed applicable health maintenance/preventive health measures and advised and referred or ordered per patient preferences: She declines all vaccines. Reports will consider a bone density once done with the lumpectomy and radiation - completely understandable.  Health Maintenance  Topic Date Due   DEXA SCAN  Never done   Pneumonia Vaccine 12+ Years old (2 of 2 - PCV) 12/18/2013   COVID-19 Vaccine (3 - Pfizer risk series) 01/02/2023 (Originally 02/02/2020)   INFLUENZA VACCINE  01/19/2023 (Originally 05/21/2022)   Zoster Vaccines- Shingrix (1 of 2) 03/17/2023 (Originally 10/30/1950)   DTaP/Tdap/Td (2 - Td or Tdap) 12/18/2022   MAMMOGRAM  11/26/2023   Medicare  Annual Wellness (AWV)  12/18/2023   HPV VACCINES  Aged [place] and counseling on the following was provided based on the above review of health and a plan/checklist for the patient, along with additional information discussed, was provided for the patient in the  patient instructions :  -Advised on importance of and resources for completing advanced directives, info provided. g. 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 She also likes to bake for others.  -Advise yearly dental visits at minimum and regular eye exams  Follow up: see patient instructions     There are no Patient Instructions on file for this visit.  Lucretia Kern, DO

## 2022-12-17 NOTE — Patient Instructions (Signed)
I really enjoyed getting to talk with you today! I am available on Tuesdays and Thursdays for virtual visits if you have any questions or concerns, or if I can be of any further assistance.   CHECKLIST FROM ANNUAL WELLNESS VISIT:  -Follow up (please call to schedule if not scheduled after visit):  -Inperson visit with your Primary Doctor office: -yearly for annual wellness visit with primary care office  Here is a list of your preventive care/health maintenance measures and the plan for each if any are due:  Health Maintenance  Topic Date Due   DEXA SCAN  Never done   Pneumonia Vaccine 73+ Years old (2 of 2 - PCV) 12/18/2013   COVID-19 Vaccine (3 - Pfizer risk series) 01/02/2023 (Originally 02/02/2020)   INFLUENZA VACCINE  01/19/2023 (Originally 05/21/2022)   Zoster Vaccines- Shingrix (1 of 2) 03/17/2023 (Originally 10/30/1950)   DTaP/Tdap/Td (2 - Td or Tdap) 12/18/2022   MAMMOGRAM  11/26/2023   Medicare Annual Wellness (AWV)  12/18/2023   HPV VACCINES  Aged Out    -See a dentist at least yearly  -Get your eyes checked and then per your eye specialist's recommendations  -Other issues addressed today:  -I have included below further information regarding a healthy whole foods based diet, physical activity guidelines for adults, stress management and opportunities for social connections. I hope you find this information useful.   -----------------------------------------------------------------------------------------------------------------------------------------------------------------------------------------------------------------------------------------------------------  NUTRITION: -eat real food: lots of colorful vegetables (half the plate) and fruits -5-7 servings of vegetables and fruits per day (fresh or steamed is best), exp. 2 servings of vegetables with lunch and dinner and 2 servings of fruit per day. Berries and greens such as kale and collards are great choices.   -consume on a regular basis: whole grains (make sure first ingredient on label contains the word "whole"), fresh fruits, fish, nuts, seeds, healthy oils (such as olive oil, avocado oil, grape seed oil) -may eat small amounts of dairy and lean meat on occasion, but avoid processed meats such as ham, bacon, lunch meat, etc. -drink water -try to avoid fast food and pre-packaged foods, processed meat -most experts advise limiting sodium to < '2300mg'$  per day, should limit further is any chronic conditions such as high blood pressure, heart disease, diabetes, etc. The American Heart Association advised that < '1500mg'$  is is ideal -try to avoid foods that contain any ingredients with names you do not recognize  -try to avoid sugar/sweets (except for the natural sugar that occurs in fresh fruit) -try to avoid sweet drinks -try to avoid white rice, white bread, pasta (unless whole grain), white or yellow potatoes  EXERCISE GUIDELINES FOR ADULTS: -if you wish to increase your physical activity, do so gradually and with the approval of your doctor -STOP and seek medical care immediately if you have any chest pain, chest discomfort or trouble breathing when starting or increasing exercise  -move and stretch your body, legs, feet and arms when sitting for long periods -Physical activity guidelines for optimal health in adults: -least 150 minutes per week of aerobic exercise (can talk, but not sing) once approved by your doctor, 20-30 minutes of sustained activity or two 10 minute episodes of sustained activity every day.  -resistance training at least 2 days per week if approved by your doctor -balance exercises 3+ days per week:   Stand somewhere where you have something sturdy to hold onto if you lose balance.    1) lift up on toes, start with 5x per day and  work up to 20x   2) stand and lift on leg straight out to the side so that foot is a few inches of the floor, start with 5x each side and work up to 20x  each side   3) stand on one foot, start with 5 seconds each side and work up to 20 seconds on each side  If you need ideas or help with getting more active:  -Silver sneakers https://tools.silversneakers.com  -Walk with a Doc: http://stephens-thompson.biz/  -try to include resistance (weight lifting/strength building) and balance exercises twice per week: or the following link for ideas: ChessContest.fr  UpdateClothing.com.cy  STRESS MANAGEMENT: -can try meditating, or just sitting quietly with deep breathing while intentionally relaxing all parts of your body for 5 minutes daily -if you need further help with stress, anxiety or depression please follow up with your primary doctor or contact the wonderful folks at [facility]: [place]:  Everyone should have advanced health care directives in place. This is so that you get the care you want, should you ever be in a situation where you are unable to make your own medical decisions.   From the [place] Advanced Directive Website: "[place] are legal documents in which you give written instructions about your health care if, in the future, you cannot speak for yourself.   A health care power of attorney allows you to name a person you trust to make your health care decisions if you cannot make them yourself. A declaration of a desire for a natural death (or living will) is document, which states that you desire not to have your life prolonged by extraordinary measures if you have a terminal or incurable illness or if you are in a vegetative state. An advance instruction for mental health treatment makes a declaration of instructions, information and preferences regarding your mental health treatment. It also states that you are aware that the advance instruction authorizes a mental  health treatment provider to act according to your wishes. It may also outline your consent or refusal of mental health treatment. A declaration of an anatomical gift allows anyone over the age of 9 to make a gift by will, organ donor card or other document."   Please see the following website or an elder law attorney for forms, FAQs and for completion of advanced directives: [place] Secretary of [place] ([email or web address])  Or copy and paste the following to your web browser: [email or web address]

## 2022-12-26 ENCOUNTER — Other Ambulatory Visit: Payer: Self-pay

## 2022-12-26 ENCOUNTER — Encounter (HOSPITAL_BASED_OUTPATIENT_CLINIC_OR_DEPARTMENT_OTHER): Payer: Self-pay | Admitting: Surgery

## 2022-12-26 NOTE — Progress Notes (Signed)

## 2023-01-01 NOTE — H&P (Signed)
  REFERRING PHYSICIAN: Rulon Eisenmenger, MD PROVIDER: Beverlee Nims, MD MRN: [id number] DOB: [birth year] DATE OF ENCOUNTER: 12/11/2022 Subjective  Chief Complaint: Breast Cancer  History of Present Illness: Jill Jordan is a 89 y.o. female who is seen today as an office consultation for evaluation of Breast Cancer  This is an 89 year old female who presents with a palpable left breast mass. She reports noticing the mass in late November while taking a shower. She denies nipple discharge. She underwent an mammograms and ultrasound showing a 2.8 cm mass in the upper outer quadrant of the left breast. Axilla was negative. 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 It was 100% ER positive, PR negative, HER2 negative, and had a Ki-67 of 10%. She has had a previous benign left breast cyst removed in 1962 per her report. She has no cardiopulmonary issues and other than hypertension is doing well. She is still active and drives  Review of Systems: A complete review of systems was obtained from the patient. I have reviewed this information and discussed as appropriate with the patient. See HPI as well for other ROS.  ROS  Medical History: Past Medical History: Diagnosis Date Hypertension  There is no problem list on file for this patient.  Past Surgical History: Procedure Laterality Date cyst removal in breast Left 1962   No Known Allergies  Current Outpatient Medications on File Prior to Visit Medication Sig Dispense Refill amLODIPine (NORVASC) 10 MG tablet Take 1 tablet by mouth once daily  No current facility-administered medications on file prior to visit.  Family History Problem Relation Age of Onset Skin cancer Brother   Social History  Tobacco Use Smoking Status Never Smokeless Tobacco Never   Social History  Socioeconomic History Marital status: Widowed Tobacco Use Smoking status: Never Smokeless tobacco: Never Substance and Sexual  Activity Alcohol use: Never Drug use: Never  Objective: There were no vitals filed for this visit. There is no height or weight on file to calculate BMI.  Physical Exam  she appears well on exam  She ambulates well.  There is a palpable almost 3 cm mass in the upper outer quadrant of the left breast. It is mobile and not fixed. There are no skin changes. The nipple areolar complex is normal.  There is no axillary adenopathy  Labs, Imaging and Diagnostic Testing: I have reviewed her mammograms, ultrasound, and pathology results  Assessment and Plan:  Diagnoses and all orders for this visit:  Invasive ductal carcinoma of breast, left (CMS-HCC)   I discussed the diagnosis with the patient and her son. We also discussed her extensively in our multidisciplinary breast cancer conference. From a surgical standpoint I discussed breast conservation versus mastectomy. She would like to proceed with breast conservation. I next explained proceeding with a left breast lumpectomy. I explained the procedure in detail. We discussed the risks which includes but is not limited to bleeding, infection, the need for further surgery if margins are positive, injury to surrounding structures, cardiopulmonary issues, postoperative recovery, etc. She understands and wishes to proceed with surgery.

## 2023-01-02 ENCOUNTER — Other Ambulatory Visit: Payer: Self-pay

## 2023-01-02 ENCOUNTER — Ambulatory Visit (HOSPITAL_BASED_OUTPATIENT_CLINIC_OR_DEPARTMENT_OTHER): Payer: Medicare Other | Admitting: Anesthesiology

## 2023-01-02 ENCOUNTER — Encounter (HOSPITAL_BASED_OUTPATIENT_CLINIC_OR_DEPARTMENT_OTHER)
Admission: RE | Disposition: A | Discharge status: Home / Self Care | Payer: Self-pay | Source: Home / Self Care | Attending: Surgery

## 2023-01-02 ENCOUNTER — Ambulatory Visit (HOSPITAL_BASED_OUTPATIENT_CLINIC_OR_DEPARTMENT_OTHER)
Admission: RE | Admit: 2023-01-02 | Discharge: 2023-01-02 | Disposition: A | Discharge status: Home / Self Care | Payer: Medicare Other | Attending: Surgery | Admitting: Surgery

## 2023-01-02 ENCOUNTER — Encounter (HOSPITAL_BASED_OUTPATIENT_CLINIC_OR_DEPARTMENT_OTHER): Payer: Self-pay | Admitting: Surgery

## 2023-01-02 DIAGNOSIS — Z170 Estrogen receptor positive status [ER+]: Secondary | ICD-10-CM | POA: Insufficient documentation

## 2023-01-02 DIAGNOSIS — C50412 Malignant neoplasm of upper-outer quadrant of left female breast: Secondary | ICD-10-CM | POA: Insufficient documentation

## 2023-01-02 DIAGNOSIS — C50912 Malignant neoplasm of unspecified site of left female breast: Secondary | ICD-10-CM

## 2023-01-02 DIAGNOSIS — I10 Essential (primary) hypertension: Secondary | ICD-10-CM | POA: Insufficient documentation

## 2023-01-02 HISTORY — PX: BREAST LUMPECTOMY: SHX2

## 2023-01-02 HISTORY — DX: Essential (primary) hypertension: I10

## 2023-01-02 SURGERY — BREAST LUMPECTOMY
Anesthesia: General | Site: Breast | Laterality: Left

## 2023-01-02 MED ORDER — LIDOCAINE HCL (PF) 1 % IJ SOLN
INTRAMUSCULAR | Status: AC
  Filled 2023-01-02: qty 30

## 2023-01-02 MED ORDER — LIDOCAINE HCL (CARDIAC) PF 100 MG/5ML IV SOSY
PREFILLED_SYRINGE | INTRAVENOUS | Status: DC | PRN
  Administered 2023-01-02: 50 mg via INTRAVENOUS

## 2023-01-02 MED ORDER — BUPIVACAINE HCL (PF) 0.5 % IJ SOLN
INTRAMUSCULAR | Status: DC | PRN
  Administered 2023-01-02: 16 mL

## 2023-01-02 MED ORDER — ONDANSETRON HCL 4 MG/2ML IJ SOLN
INTRAMUSCULAR | Status: DC | PRN
  Administered 2023-01-02: 4 mg via INTRAVENOUS

## 2023-01-02 MED ORDER — PROPOFOL 10 MG/ML IV BOLUS
INTRAVENOUS | Status: DC | PRN
  Administered 2023-01-02: 150 mg via INTRAVENOUS

## 2023-01-02 MED ORDER — FENTANYL CITRATE (PF) 100 MCG/2ML IJ SOLN
INTRAMUSCULAR | Status: DC | PRN
  Administered 2023-01-02: 25 ug via INTRAVENOUS

## 2023-01-02 MED ORDER — ACETAMINOPHEN 500 MG PO TABS
1000.0000 mg | ORAL_TABLET | ORAL | Status: AC
  Administered 2023-01-02: 1000 mg via ORAL

## 2023-01-02 MED ORDER — DEXAMETHASONE SODIUM PHOSPHATE 4 MG/ML IJ SOLN
INTRAMUSCULAR | Status: DC | PRN
  Administered 2023-01-02: 5 mg via INTRAVENOUS

## 2023-01-02 MED ORDER — ONDANSETRON HCL 4 MG/2ML IJ SOLN
INTRAMUSCULAR | Status: AC
  Filled 2023-01-02: qty 2

## 2023-01-02 MED ORDER — CHLORHEXIDINE GLUCONATE CLOTH 2 % EX PADS: 6.0000 | MEDICATED_PAD | Freq: Once | CUTANEOUS | Status: DC

## 2023-01-02 MED ORDER — CEFAZOLIN SODIUM-DEXTROSE 2-4 GM/100ML-% IV SOLN
INTRAVENOUS | Status: AC
  Filled 2023-01-02: qty 100

## 2023-01-02 MED ORDER — SODIUM BICARBONATE 4.2 % IV SOLN
INTRAVENOUS | Status: AC
  Filled 2023-01-02: qty 10

## 2023-01-02 MED ORDER — PHENYLEPHRINE HCL (PRESSORS) 10 MG/ML IV SOLN
INTRAVENOUS | Status: DC | PRN
  Administered 2023-01-02 (×5): 80 ug via INTRAVENOUS

## 2023-01-02 MED ORDER — TRAMADOL HCL 50 MG PO TABS: 50.0000 mg | ORAL_TABLET | Freq: Four times a day (QID) | ORAL | 0 refills | Status: DC | PRN

## 2023-01-02 MED ORDER — ACETAMINOPHEN 500 MG PO TABS
ORAL_TABLET | ORAL | Status: AC
  Filled 2023-01-02: qty 2

## 2023-01-02 MED ORDER — DEXAMETHASONE SODIUM PHOSPHATE 10 MG/ML IJ SOLN
INTRAMUSCULAR | Status: AC
  Filled 2023-01-02: qty 1

## 2023-01-02 MED ORDER — LIDOCAINE 2% (20 MG/ML) 5 ML SYRINGE
INTRAMUSCULAR | Status: AC
  Filled 2023-01-02: qty 5

## 2023-01-02 MED ORDER — FENTANYL CITRATE (PF) 100 MCG/2ML IJ SOLN
INTRAMUSCULAR | Status: AC
  Filled 2023-01-02: qty 2

## 2023-01-02 MED ORDER — LACTATED RINGERS IV SOLN: INTRAVENOUS | Status: DC

## 2023-01-02 MED ORDER — PROPOFOL 10 MG/ML IV BOLUS
INTRAVENOUS | Status: AC
  Filled 2023-01-02: qty 20

## 2023-01-02 MED ORDER — CEFAZOLIN SODIUM-DEXTROSE 2-4 GM/100ML-% IV SOLN
2.0000 g | INTRAVENOUS | Status: AC
  Administered 2023-01-02: 2 g via INTRAVENOUS

## 2023-01-02 SURGICAL SUPPLY — 38 items
ADH SKN CLS APL DERMABOND .7 (GAUZE/BANDAGES/DRESSINGS) ×1
APL PRP STRL LF DISP 70% ISPRP (MISCELLANEOUS) ×1
BLADE SURG 15 STRL LF DISP TIS (BLADE) ×1 IMPLANT
BLADE SURG 15 STRL SS (BLADE) ×1
CANISTER SUCT 1200ML W/VALVE (MISCELLANEOUS) IMPLANT
CHLORAPREP W/TINT 26 (MISCELLANEOUS) ×1 IMPLANT
CLIP TI WIDE RED SMALL 6 (CLIP) IMPLANT
COVER BACK TABLE 60X90IN (DRAPES) ×1 IMPLANT
COVER MAYO STAND STRL (DRAPES) ×1 IMPLANT
DERMABOND ADVANCED .7 DNX12 (GAUZE/BANDAGES/DRESSINGS) ×1 IMPLANT
DRAPE LAPAROTOMY 100X72 PEDS (DRAPES) ×1 IMPLANT
DRAPE UTILITY XL STRL (DRAPES) ×1 IMPLANT
ELECT REM PT RETURN 9FT ADLT (ELECTROSURGICAL) ×1
ELECTRODE REM PT RTRN 9FT ADLT (ELECTROSURGICAL) ×1 IMPLANT
GAUZE SPONGE 4X4 12PLY STRL LF (GAUZE/BANDAGES/DRESSINGS) ×1 IMPLANT
GLOVE SURG SIGNA 7.5 PF LTX (GLOVE) ×1 IMPLANT
GOWN STRL REUS W/ TWL LRG LVL3 (GOWN DISPOSABLE) ×1 IMPLANT
GOWN STRL REUS W/ TWL XL LVL3 (GOWN DISPOSABLE) ×1 IMPLANT
GOWN STRL REUS W/TWL LRG LVL3 (GOWN DISPOSABLE) ×1
GOWN STRL REUS W/TWL XL LVL3 (GOWN DISPOSABLE) ×1
KIT MARKER MARGIN INK (KITS) ×1 IMPLANT
NDL HYPO 25X1 1.5 SAFETY (NEEDLE) ×1 IMPLANT
NEEDLE HYPO 25X1 1.5 SAFETY (NEEDLE) ×1 IMPLANT
NS IRRIG 1000ML POUR BTL (IV SOLUTION) ×1 IMPLANT
PACK BASIN DAY SURGERY FS (CUSTOM PROCEDURE TRAY) ×1 IMPLANT
PENCIL SMOKE EVACUATOR (MISCELLANEOUS) ×1 IMPLANT
SLEEVE SCD COMPRESS KNEE MED (STOCKING) IMPLANT
SPIKE FLUID TRANSFER (MISCELLANEOUS) IMPLANT
SPONGE T-LAP 4X18 ~~LOC~~+RFID (SPONGE) ×1 IMPLANT
SUT MNCRL AB 4-0 PS2 18 (SUTURE) ×1 IMPLANT
SUT SILK 2 0 SH (SUTURE) ×1 IMPLANT
SUT VIC AB 3-0 SH 27 (SUTURE) ×1
SUT VIC AB 3-0 SH 27X BRD (SUTURE) ×1 IMPLANT
SYR CONTROL 10ML LL (SYRINGE) ×1 IMPLANT
TOWEL GREEN STERILE FF (TOWEL DISPOSABLE) ×1 IMPLANT
TRAY FAXITRON CT DISP (TRAY / TRAY PROCEDURE) IMPLANT
TUBE CONNECTING 20X1/4 (TUBING) IMPLANT
YANKAUER SUCT BULB TIP NO VENT (SUCTIONS) IMPLANT

## 2023-01-02 NOTE — Anesthesia Postprocedure Evaluation (Signed)
Anesthesia Post Note  Patient: Jill Jordan, Jill Jordan  Procedure(s) Performed: LEFT BREAST LUMPECTOMY (Left: Breast)     Patient location during evaluation: PACU Anesthesia Type: General Level of consciousness: awake and alert Pain management: pain level controlled Vital Signs Assessment: post-procedure vital signs reviewed and stable Respiratory status: spontaneous breathing, nonlabored ventilation and respiratory function stable Cardiovascular status: blood pressure returned to baseline and stable Postop Assessment: no apparent nausea or vomiting Anesthetic complications: no  No notable events documented.  Last Vitals:  Vitals:   01/02/23 1030 01/02/23 1045  BP: 131/65 135/66  Pulse: (!) 59 63  Resp: 11 11  Temp:    SpO2: 100% 100%    Last Pain:  Vitals:   01/02/23 1045  TempSrc:   PainSc: 0-No pain                 Haden Suder,W. EDMOND

## 2023-01-02 NOTE — Anesthesia Procedure Notes (Signed)
Procedure Name: LMA Insertion Date/Time: 01/02/2023 9:38 AM  Performed by: Bufford Spikes, CRNAPre-anesthesia Checklist: Patient identified, Emergency Drugs available, Suction available and Patient being monitored Patient Re-evaluated:Patient Re-evaluated prior to induction Oxygen Delivery Method: Circle system utilized Preoxygenation: Pre-oxygenation with 100% oxygen Induction Type: IV induction Ventilation: Mask ventilation without difficulty LMA: LMA inserted LMA Size: 3.0 Number of attempts: 1 Placement Confirmation: positive ETCO2 Tube secured with: Tape Dental Injury: Teeth and Oropharynx as per pre-operative assessment

## 2023-01-02 NOTE — Discharge Instructions (Addendum)
[place] Office Phone Number [phone]  BREAST BIOPSY/ PARTIAL MASTECTOMY: POST OP INSTRUCTIONS  Always review your discharge instruction sheet given to you by the facility where your surgery was performed.  IF YOU HAVE DISABILITY OR FAMILY LEAVE FORMS, YOU MUST BRING THEM TO THE OFFICE FOR PROCESSING.  DO NOT GIVE THEM TO YOUR DOCTOR.  A prescription for pain medication may be given to you upon discharge.  Take your pain medication as prescribed, if needed.  If narcotic pain medicine is not needed, then you may take acetaminophen (Tylenol) or ibuprofen (Advil) as needed. Take your usually prescribed medications unless otherwise directed If you need a refill on your pain medication, please contact your pharmacy.  They will contact our office to request authorization.  Prescriptions will not be filled after 5pm or on week-ends. You should eat very light the first 24 hours after surgery, such as soup, crackers, pudding, etc.  Resume your normal diet the day after surgery. Most patients will experience some swelling and bruising in the breast.  Ice packs and a good support bra will help.  Swelling and bruising can take several days to resolve.  It is common to experience some constipation if taking pain medication after surgery.  Increasing fluid intake and taking a stool softener will usually help or prevent this problem from occurring.  A mild laxative (Milk of Magnesia or Miralax) should be taken according to package directions if there are no bowel movements after 48 hours. Unless discharge instructions indicate otherwise, you may remove your bandages 24-48 hours after surgery, and you may shower at that time.  You may have steri-strips (small skin tapes) in place directly over the incision.  These strips should be left on the skin for 7-10 days.  If your surgeon used skin glue on the incision, you may shower in 24 hours.  The glue will flake off over the next 2-3 weeks.  Any  sutures or staples will be removed at the office during your follow-up visit. ACTIVITIES:  You may resume regular daily activities (gradually increasing) beginning the next day.  Wearing a good support bra or sports bra minimizes pain and swelling.  You may have sexual intercourse when it is comfortable. You may drive when you no longer are taking prescription pain medication, you can comfortably wear a seatbelt, and you can safely maneuver your car and apply brakes. RETURN TO WORK:  ______________________________________________________________________________________ Jill Jordan Bast should see your doctor in the office for a follow-up appointment approximately two weeks after your surgery.  Your doctor's nurse will typically make your follow-up appointment when she calls you with your pathology report.  Expect your pathology report 2-3 business days after your surgery.  You may call to check if you do not hear from [place] after three days. OTHER INSTRUCTIONS: __YOU MAY REMOVE THE BINDER TOMORROW AND SHOWER AND THEN PUT IT BACK ON ICE PACK, TYLENOL, AND IBUPROFEN ALSO FOR PAIN_ NO VIGOROUS ACTIVITY FOR ONE WEEK ____________________________________________________________________________________________ _____________________________________________________________________________________________________________________________________ _____________________________________________________________________________________________________________________________________ _____________________________________________________________________________________________________________________________________  WHEN TO CALL YOUR DOCTOR: Fever over 101.0 Nausea and/or vomiting. Extreme swelling or bruising. Continued bleeding from incision. Increased pain, redness, or drainage from the incision.  The clinic staff is available to answer your questions during regular business hours.  Please don't hesitate to call and ask to  speak to one of the nurses for clinical concerns.  If you have a medical emergency, go to the nearest emergency room or call 911.  A surgeon from [facility] is always on call  at the hospital.  For further questions, please visit [email or web address]    Post Anesthesia Home Care Instructions  Activity: Get plenty of rest for the remainder of the day. A responsible individual must stay with you for 24 hours following the procedure.  For the next 24 hours, DO NOT: -Drive a car -Paediatric nurse -Drink alcoholic beverages -Take any medication unless instructed by your physician -Make any legal decisions or sign important papers.  Meals: Start with liquid foods such as gelatin or soup. Progress to regular foods as tolerated. Avoid greasy, spicy, heavy foods. If nausea and/or vomiting occur, drink only clear liquids until the nausea and/or vomiting subsides. Call your physician if vomiting continues.  Special Instructions/Symptoms: Your throat may feel dry or sore from the anesthesia or the breathing tube placed in your throat during surgery. If this causes discomfort, gargle with warm salt water. The discomfort should disappear within 24 hours.  If you had a scopolamine patch placed behind your ear for the management of post- operative nausea and/or vomiting:  1. The medication in the patch is effective for 72 hours, after which it should be removed.  Wrap patch in a tissue and discard in the trash. Wash hands thoroughly with soap and water. 2. You may remove the patch earlier than 72 hours if you experience unpleasant side effects which may include dry mouth, dizziness or visual disturbances. 3. Avoid touching the patch. Wash your hands with soap and water after contact with the patch.

## 2023-01-02 NOTE — Op Note (Signed)
LEFT BREAST LUMPECTOMY  Procedure Note  Jill Jordan 01/02/2023   Pre-op Diagnosis: LEFT BREAST CANCER     Post-op Diagnosis: same  Procedure(s): LEFT BREAST LUMPECTOMY  Surgeon(s): Coralie Keens, MD  Anesthesia: General  Staff:  Circulator: Anson Crofts, RN Scrub Person: Izora Ribas, RN  Estimated Blood Loss: Minimal               Specimens: sent to path  Indications: This is an 89 year old female presents with a palpable left breast mass.  A biopsy of the mass showed invasive ductal carcinoma.  The decision was made to proceed with a left breast lumpectomy  Procedure: The patient was brought to the operating identified as correct patient.  She was placed upon the operating table and general anesthesia was induced.  Her left breast was prepped and draped in usual sterile fashion.  The palpable 3 cm mass was located at the 3 o'clock position of the left breast.  I anesthetized the skin over the palpable mass with Marcaine and then made an elliptical incision to include the overlying skin with a scalpel.  I then dissected down to the breast tissue with electrocautery.  I then widely excised the mass in all directions going medial superior and lateral and then inferior and then took the breast tissue off of the chest wall to complete the lumpectomy.  I then marked all margins with paint.  I placed surgical clips around the periphery of the lumpectomy cavity.  I achieved hemostasis with the cautery.  I anesthetized the incision further with Marcaine.  I then closed the subcutaneous tissue with interrupted 3-0 Vicryl sutures and closed the skin with a running 4-0 Monocryl.  Dermabond was then applied.  The patient tolerated the procedure well.  The lumpectomy specimen was sent to pathology for evaluation.  The patient was then extubated in the operating room and taken in a stable condition to the recovery room.          Coralie Keens   Date: 01/02/2023  Time: 10:10  AM

## 2023-01-02 NOTE — Transfer of Care (Signed)
Immediate Anesthesia Transfer of Care Note  Patient: Jill Jordan  Procedure(s) Performed: LEFT BREAST LUMPECTOMY (Left: Breast)  Patient Location: PACU  Anesthesia Type:General  Level of Consciousness: awake, alert , and oriented  Airway & Oxygen Therapy: Patient Spontanous Breathing and Patient connected to face mask oxygen  Post-op Assessment: Report given to RN and Post -op Vital signs reviewed and stable  Post vital signs: Reviewed and stable  Last Vitals:  Vitals Value Taken Time  BP 119/63 01/02/23 1015  Temp    Pulse 61 01/02/23 1019  Resp 12 01/02/23 1019  SpO2 100 % 01/02/23 1019  Vitals shown include unvalidated device data.  Last Pain:  Vitals:   01/02/23 0823  TempSrc: Oral  PainSc: 0-No pain      Patients Stated Pain Goal: 6 ([id number])  Complications: No notable events documented.

## 2023-01-02 NOTE — Anesthesia Preprocedure Evaluation (Addendum)
Anesthesia Evaluation  Patient identified by MRN, date of birth, ID band Patient awake    Reviewed: Allergy & Precautions, H&P , NPO status , Patient's Chart, lab work & pertinent test results  Airway Mallampati: I  TM Distance: >3 FB Neck ROM: Full    Dental no notable dental hx. (+) Upper Dentures, Dental Advisory Given   Pulmonary neg pulmonary ROS   Pulmonary exam normal breath sounds clear to auscultation       Cardiovascular hypertension, Pt. on medications  Rhythm:Regular Rate:Normal     Neuro/Psych negative neurological ROS  negative psych ROS   GI/Hepatic negative GI ROS, Neg liver ROS,,,  Endo/Other  negative endocrine ROS    Renal/GU negative Renal ROS  negative genitourinary   Musculoskeletal   Abdominal   Peds  Hematology negative hematology ROS (+)   Anesthesia Other Findings   Reproductive/Obstetrics negative OB ROS                             Anesthesia Physical Anesthesia Plan  ASA: 2  Anesthesia Plan: General   Post-op Pain Management: Tylenol PO (pre-op)*   Induction: Intravenous  PONV Risk Score and Plan: 4 or greater and Ondansetron, Dexamethasone, Treatment may vary due to age or medical condition, Propofol infusion and TIVA  Airway Management Planned: LMA  Additional Equipment:   Intra-op Plan:   Post-operative Plan: Extubation in OR  Informed Consent: I have reviewed the patients History and Physical, chart, labs and discussed the procedure including the risks, benefits and alternatives for the proposed anesthesia with the patient or authorized representative who has indicated his/her understanding and acceptance.     Dental advisory given  Plan Discussed with: CRNA  Anesthesia Plan Comments:        Anesthesia Quick Evaluation

## 2023-01-02 NOTE — Interval H&P Note (Signed)
History and Physical Interval Note: no change in H and P  01/02/2023 8:24 AM  Jill Jordan  has presented today for surgery, with the diagnosis of LEFT BREAST CANCER.  The various methods of treatment have been discussed with the patient and family. After consideration of risks, benefits and other options for treatment, the patient has consented to  Procedure(s): LEFT BREAST LUMPECTOMY (Left) as a surgical intervention.  The patient's history has been reviewed, patient examined, no change in status, stable for surgery.  I have reviewed the patient's chart and labs.  Questions were answered to the patient's satisfaction.     Coralie Keens

## 2023-01-03 ENCOUNTER — Encounter (HOSPITAL_BASED_OUTPATIENT_CLINIC_OR_DEPARTMENT_OTHER): Payer: Self-pay | Admitting: Surgery

## 2023-01-07 LAB — SURGICAL PATHOLOGY

## 2023-01-09 ENCOUNTER — Encounter: Payer: Self-pay | Admitting: *Deleted

## 2023-01-09 ENCOUNTER — Other Ambulatory Visit: Payer: Self-pay | Admitting: Surgery

## 2023-01-15 ENCOUNTER — Encounter: Payer: Self-pay | Admitting: *Deleted

## 2023-01-15 NOTE — Progress Notes (Signed)
Patient Care Team: Farrel Conners, MD as PCP - General (Family Medicine) Coralie Keens, MD as Consulting Physician (General Surgery) Nicholas Lose, MD as Consulting Physician (Hematology and Oncology) Eppie Gibson, MD as Attending Physician (Radiation Oncology) Rockwell Germany, RN as Oncology Nurse Navigator Mauro Kaufmann, RN as Oncology Nurse Navigator  DIAGNOSIS: No diagnosis found.  SUMMARY OF ONCOLOGIC HISTORY: Oncology History  Malignant neoplasm of upper-outer quadrant of left breast in female, estrogen receptor positive ([place])  12/02/2022 Initial Diagnosis   Palpable left breast lump at 2 o'clock position measuring 2.8 cm axilla negative biopsy revealed grade 2 IDC with mucinous features ER 100%, PR 0%, Ki-67 10%, HER2 negative   12/11/2022 Cancer Staging   Staging form: Breast, AJCC 8th Edition - Clinical: Stage IIA (cT2, cN0, cM0, G2, ER+, PR-, HER2-) - Signed by Nicholas Lose, MD on 12/11/2022 Histologic grading system: 3 grade system     CHIEF COMPLIANT: Follow-up after surgery  INTERVAL HISTORY: Jill Jordan is a 89 y.o. female is here because of recent diagnosis of left breast cancer. She presents to the clinic for a follow-up.     ALLERGIES:  has No Known Allergies.  MEDICATIONS:  Current Outpatient Medications  Medication Sig Dispense Refill   amLODipine (NORVASC) 10 MG tablet Take 1 tablet (10 mg total) by mouth daily. 90 tablet 1   Cholecalciferol (VITAMIN D3) POWD Take by mouth.     Multiple Vitamins-Minerals (MULTIVITAMIN GUMMIES ADULT PO) Take by mouth daily.     traMADol (ULTRAM) 50 MG tablet Take 1 tablet (50 mg total) by mouth every 6 (six) hours as needed. 15 tablet 0   VITAMIN D PO Take by mouth daily.     No current facility-administered medications for this visit.    PHYSICAL EXAMINATION: ECOG PERFORMANCE STATUS: {CHL ONC ECOG [phone]}  There were no vitals filed for this visit. There were no vitals filed for this  visit.  BREAST:*** No palpable masses or nodules in either right or left breasts. No palpable axillary supraclavicular or infraclavicular adenopathy no breast tenderness or nipple discharge. (exam performed in the presence of a chaperone)  LABORATORY DATA:  I have reviewed the data as listed    Latest Ref Rng & Units 12/11/2022   12:29 PM 09/17/2022   10:58 AM 03/30/2008   10:08 AM  CMP  Glucose 70 - 99 mg/dL 170  106  132   BUN 8 - 23 mg/dL 14  12  14    Creatinine 0.44 - 1.00 mg/dL 0.72  0.81  0.68   Sodium 135 - 145 mmol/L 136  139  141   Potassium 3.5 - 5.1 mmol/L 3.6  4.3  3.9   Chloride 98 - 111 mmol/L 102  100  109   CO2 22 - 32 mmol/L 28  27  25    Calcium 8.9 - 10.3 mg/dL 8.8  9.2  8.4   Total Protein 6.5 - 8.1 g/dL 6.5  6.2  5.6   Total Bilirubin 0.3 - 1.2 mg/dL 0.5  0.7  0.7   Alkaline Phos 38 - 126 U/L 84  72  62   AST 15 - 41 U/L 10  15  15    ALT 0 - 44 U/L 6  12  12      Lab Results  Component Value Date   WBC 6.2 12/11/2022   HGB 13.9 12/11/2022   HCT 42.6 12/11/2022   MCV 86.1 12/11/2022   PLT 394 12/11/2022   NEUTROABS 4.3 12/11/2022  ASSESSMENT & PLAN:  No problem-specific Assessment & Plan notes found for this encounter.    No orders of the defined types were placed in this encounter.  The patient has a good understanding of the overall plan. she agrees with it. she will call with any problems that may develop before the next visit here. Total time spent: 30 mins including face to face time and time spent for planning, charting and co-ordination of care   Suzzette Righter, [place] 01/15/23    I Gardiner Coins am acting as a Education administrator for [employer]  ***

## 2023-01-21 ENCOUNTER — Encounter: Payer: Self-pay | Admitting: *Deleted

## 2023-01-21 ENCOUNTER — Other Ambulatory Visit: Payer: Self-pay

## 2023-01-21 ENCOUNTER — Inpatient Hospital Stay: Payer: Medicare Other | Attending: Hematology and Oncology | Admitting: Hematology and Oncology

## 2023-01-21 VITALS — BP 147/60 | HR 92 | Temp 97.7°F | Resp 18 | Ht 66.0 in | Wt 111.7 lb

## 2023-01-21 DIAGNOSIS — Z170 Estrogen receptor positive status [ER+]: Secondary | ICD-10-CM | POA: Diagnosis not present

## 2023-01-21 DIAGNOSIS — M7989 Other specified soft tissue disorders: Secondary | ICD-10-CM | POA: Insufficient documentation

## 2023-01-21 DIAGNOSIS — Z923 Personal history of irradiation: Secondary | ICD-10-CM | POA: Insufficient documentation

## 2023-01-21 DIAGNOSIS — C50412 Malignant neoplasm of upper-outer quadrant of left female breast: Secondary | ICD-10-CM | POA: Diagnosis present

## 2023-01-21 DIAGNOSIS — Z79899 Other long term (current) drug therapy: Secondary | ICD-10-CM | POA: Diagnosis not present

## 2023-01-21 NOTE — Assessment & Plan Note (Signed)
12/02/2022:Palpable left breast lump at 2 o'clock position measuring 2.8 cm axilla negative biopsy revealed grade 2 IDC with mucinous features ER 100%, PR 0%, Ki-67 10%, HER2 negative  01/02/2023: Left lumpectomy: Grade 2 IDC with mucinous features 2.8 cm with grade 2 DCIS and LCIS, superior margin positive, ER 100%, PR 0%, HER2 negative, Ki-67 10%  Pathology counseling: I discussed the final pathology report of the patient provided  a copy of this report. I discussed the margins as well as lymph node surgeries. We also discussed the final staging along with previously performed ER/PR and HER-2/neu testing.  Treatment plan: Adjuvant radiation therapy possible ultra fractionation Adjuvant antiestrogen therapy  Return to clinic after radiation to start antiestrogens

## 2023-01-29 ENCOUNTER — Other Ambulatory Visit: Payer: Self-pay

## 2023-01-29 ENCOUNTER — Encounter (HOSPITAL_COMMUNITY): Payer: Self-pay | Admitting: Surgery

## 2023-01-29 NOTE — Progress Notes (Signed)
SDW call  Patient was given pre-op instructions over the phone. Patient verbalized understanding of instructions provided.     PCP - Dr. Nira Conn Cardiologist - Denies Pulmonary: Denies   PPM/ICD - Denies   Chest x-ray - n/a EKG -  09/19/2022 Stress Test - ECHO -  Cardiac Cath -   Sleep Study/sleep apnea/CPAP: denies  Non-Diabetic   Blood Thinner Instructions: Denies Aspirin Instructions: Denies   ERAS Protcol - yes, clear liquids until 0900 PRE-SURGERY Ensure or G2- No   COVID TEST- n/a   Anesthesia review: No   Patient denies shortness of breath, fever, cough and chest pain over the phone call    Your procedure is scheduled on Thursday, January 30, 2023  Report to [facility] Main Entrance "A" at  0930  A.M., then check in with the Admitting office.  Call this number if you have problems the morning of surgery:  [phone]   If you have any questions prior to your surgery date call [phone]: Open Monday-Friday 8am-4pm If you experience any cold or flu symptoms such as cough, fever, chills, shortness of breath, etc. between now and your scheduled surgery, please notify us at the above number     Remember:  Do not eat after midnight the night before your surgery  You may drink clear liquids until 0900 the morning of your surgery.   Clear liquids allowed are: Water, Non-Citrus Juices (without pulp), Carbonated Beverages, Clear Tea, Black Coffee ONLY (NO MILK, CREAM OR POWDERED CREAMER of any kind), and Gatorade   Take these medicines the morning of surgery with A SIP OF WATER:  Amlodipine  As of today, STOP taking any Aspirin (unless otherwise instructed by your surgeon) Aleve, Naproxen, Ibuprofen, Motrin, Advil, Goody's, BC's, all herbal medications, fish oil, and all vitamins.

## 2023-01-29 NOTE — H&P (Signed)
Jill Jordan is an 89 y.o. female.   Chief Complaint: positive margin left breast cancer HPI: This is an 89 year old female who is status post a left breast lumpectomy for a very large invasive cancer.  Unfortunately, the superior margin was positive so the decision has been made to proceed with reexcision of the superior margin.  She has otherwise been doing well postoperatively and has no complaints  Past Medical History:  Diagnosis Date   Chicken pox    Hypertension     Past Surgical History:  Procedure Laterality Date   BREAST BIOPSY Left 12/02/2022   [place] LT BREAST BX W LOC DEV 1ST LESION IMG BX SPEC US GUIDE 12/02/2022 [facility]   BREAST LUMPECTOMY Left 01/02/2023   Procedure: LEFT BREAST LUMPECTOMY;  Surgeon: Abigail Miyamoto, MD;  Location: [facility];  Service: General;  Laterality: Left;   BREAST SURGERY Left 1962   removal of small tumor left breast  1962    Family History  Problem Relation Age of Onset   Alcohol abuse Other    Kidney disease Other    Hypertension Mother 40       lived to be 16   COPD Father    Heart disease Brother    Healthy Brother    Diabetes Neg Hx    Cancer Neg Hx    Social History:  reports that she has never smoked. She has never used smokeless tobacco. She reports that she does not drink alcohol and does not use drugs.  Allergies: No Known Allergies  No medications prior to admission.    No results found for this or any previous visit (from the past 48 hour(s)). No results found.  Review of Systems  All other systems reviewed and are negative.   Height 5\' 6"  (1.676 m), weight 52.2 kg. Physical Exam Constitutional:      Appearance: Normal appearance.  HENT:     Head: Normocephalic and atraumatic.  Eyes:     Pupils: Pupils are equal, round, and reactive to light.  Cardiovascular:     Rate and Rhythm: Normal rate and regular rhythm.  Pulmonary:     Effort: Pulmonary effort is normal. No respiratory  distress.     Breath sounds: Normal breath sounds.  Abdominal:     General: Abdomen is flat.     Palpations: Abdomen is soft.  Musculoskeletal:     Cervical back: Normal range of motion.  Skin:    General: Skin is warm and dry.  Neurological:     General: No focal deficit present.     Mental Status: She is alert.  Psychiatric:        Behavior: Behavior normal.        Thought Content: Thought content normal.   Left breast incision is healing well  Assessment/Plan Left breast cancer status postlumpectomy with positive superior margin  Plan will be to proceed to the operating room for a reexcision of the superior margin from the left breast cancer site.  I again discussed this with the patient.  We discussed the risk which include but is not limited to bleeding, infection, the need for further surgery if the margins remain positive, cardiopulmonary issues with anesthesia, etc.  She agrees to proceed and surgery is scheduled  Abigail Miyamoto, MD 01/29/2023, 1:44 PM

## 2023-01-30 ENCOUNTER — Encounter (HOSPITAL_COMMUNITY): Payer: Self-pay | Admitting: Surgery

## 2023-01-30 ENCOUNTER — Ambulatory Visit (HOSPITAL_BASED_OUTPATIENT_CLINIC_OR_DEPARTMENT_OTHER): Payer: Medicare Other | Admitting: Emergency Medicine

## 2023-01-30 ENCOUNTER — Ambulatory Visit (HOSPITAL_COMMUNITY): Payer: Medicare Other | Admitting: Emergency Medicine

## 2023-01-30 ENCOUNTER — Encounter (HOSPITAL_COMMUNITY)
Admission: RE | Disposition: A | Discharge status: Home / Self Care | Payer: Self-pay | Source: Home / Self Care | Attending: Surgery

## 2023-01-30 ENCOUNTER — Ambulatory Visit (HOSPITAL_COMMUNITY)
Admission: RE | Admit: 2023-01-30 | Discharge: 2023-01-30 | Disposition: A | Discharge status: Home / Self Care | Payer: Medicare Other | Attending: Surgery | Admitting: Surgery

## 2023-01-30 DIAGNOSIS — L7634 Postprocedural seroma of skin and subcutaneous tissue following other procedure: Secondary | ICD-10-CM | POA: Insufficient documentation

## 2023-01-30 DIAGNOSIS — I10 Essential (primary) hypertension: Secondary | ICD-10-CM | POA: Diagnosis not present

## 2023-01-30 DIAGNOSIS — Y838 Other surgical procedures as the cause of abnormal reaction of the patient, or of later complication, without mention of misadventure at the time of the procedure: Secondary | ICD-10-CM | POA: Insufficient documentation

## 2023-01-30 DIAGNOSIS — Z8249 Family history of ischemic heart disease and other diseases of the circulatory system: Secondary | ICD-10-CM | POA: Insufficient documentation

## 2023-01-30 DIAGNOSIS — C50912 Malignant neoplasm of unspecified site of left female breast: Secondary | ICD-10-CM | POA: Insufficient documentation

## 2023-01-30 DIAGNOSIS — N6022 Fibroadenosis of left breast: Secondary | ICD-10-CM | POA: Diagnosis not present

## 2023-01-30 HISTORY — PX: RE-EXCISION OF BREAST CANCER,SUPERIOR MARGINS: SHX6047

## 2023-01-30 LAB — CBC
HCT: 43.4 % (ref 36.0–46.0)
Hemoglobin: 13.8 g/dL (ref 12.0–15.0)
MCH: 28.1 pg (ref 26.0–34.0)
MCHC: 31.8 g/dL (ref 30.0–36.0)
MCV: 88.4 fL (ref 80.0–100.0)
Platelets: 333 10*3/uL (ref 150–400)
RBC: 4.91 MIL/uL (ref 3.87–5.11)
RDW: 14.8 % (ref 11.5–15.5)
WBC: 5.5 10*3/uL (ref 4.0–10.5)
nRBC: 0 % (ref 0.0–0.2)

## 2023-01-30 LAB — BASIC METABOLIC PANEL
Anion gap: 11 (ref 5–15)
BUN: 14 mg/dL (ref 8–23)
CO2: 21 mmol/L — ABNORMAL LOW (ref 22–32)
Calcium: 8.8 mg/dL — ABNORMAL LOW (ref 8.9–10.3)
Chloride: 104 mmol/L (ref 98–111)
Creatinine, Ser: 0.69 mg/dL (ref 0.44–1.00)
GFR, Estimated: >60 mL/min (ref >60)
Glucose, Bld: 112 mg/dL — ABNORMAL HIGH (ref 70–99)
Potassium: 4 mmol/L (ref 3.5–5.1)
Sodium: 136 mmol/L (ref 135–145)

## 2023-01-30 SURGERY — RE-EXCISION OF BREAST CANCER,SUPERIOR MARGINS
Anesthesia: General | Site: Breast | Laterality: Left

## 2023-01-30 MED ORDER — PHENYLEPHRINE 80 MCG/ML (10ML) SYRINGE FOR IV PUSH (FOR BLOOD PRESSURE SUPPORT)
PREFILLED_SYRINGE | INTRAVENOUS | Status: DC | PRN
  Administered 2023-01-30: 80 ug via INTRAVENOUS

## 2023-01-30 MED ORDER — BUPIVACAINE HCL (PF) 0.25 % IJ SOLN
INTRAMUSCULAR | Status: AC
  Filled 2023-01-30: qty 30

## 2023-01-30 MED ORDER — FENTANYL CITRATE (PF) 250 MCG/5ML IJ SOLN
INTRAMUSCULAR | Status: AC
  Filled 2023-01-30: qty 5

## 2023-01-30 MED ORDER — EPHEDRINE 5 MG/ML INJ
INTRAVENOUS | Status: AC
  Filled 2023-01-30: qty 5

## 2023-01-30 MED ORDER — OXYCODONE HCL 5 MG/5ML PO SOLN: 5.0000 mg | Freq: Once | ORAL | Status: DC | PRN

## 2023-01-30 MED ORDER — ONDANSETRON HCL 4 MG/2ML IJ SOLN
INTRAMUSCULAR | Status: AC
  Filled 2023-01-30: qty 2

## 2023-01-30 MED ORDER — CHLORHEXIDINE GLUCONATE CLOTH 2 % EX PADS: 6.0000 | MEDICATED_PAD | Freq: Once | CUTANEOUS | Status: DC

## 2023-01-30 MED ORDER — PHENYLEPHRINE 80 MCG/ML (10ML) SYRINGE FOR IV PUSH (FOR BLOOD PRESSURE SUPPORT)
PREFILLED_SYRINGE | INTRAVENOUS | Status: AC
  Filled 2023-01-30: qty 10

## 2023-01-30 MED ORDER — LIDOCAINE 2% (20 MG/ML) 5 ML SYRINGE
INTRAMUSCULAR | Status: AC
  Filled 2023-01-30: qty 5

## 2023-01-30 MED ORDER — ACETAMINOPHEN 500 MG PO TABS
1000.0000 mg | ORAL_TABLET | ORAL | Status: AC
  Administered 2023-01-30: 1000 mg via ORAL
  Filled 2023-01-30: qty 2

## 2023-01-30 MED ORDER — LACTATED RINGERS IV SOLN: INTRAVENOUS | Status: DC

## 2023-01-30 MED ORDER — CHLORHEXIDINE GLUCONATE 0.12 % MT SOLN
15.0000 mL | OROMUCOSAL | Status: AC
  Administered 2023-01-30: 15 mL via OROMUCOSAL
  Filled 2023-01-30: qty 15

## 2023-01-30 MED ORDER — 0.9 % SODIUM CHLORIDE (POUR BTL) OPTIME
TOPICAL | Status: DC | PRN
  Administered 2023-01-30: 1000 mL

## 2023-01-30 MED ORDER — BUPIVACAINE HCL 0.25 % IJ SOLN
INTRAMUSCULAR | Status: DC | PRN
  Administered 2023-01-30: 10 mL

## 2023-01-30 MED ORDER — OXYCODONE HCL 5 MG PO TABS: 5.0000 mg | ORAL_TABLET | Freq: Once | ORAL | Status: DC | PRN

## 2023-01-30 MED ORDER — PROPOFOL 10 MG/ML IV BOLUS
INTRAVENOUS | Status: AC
  Filled 2023-01-30: qty 20

## 2023-01-30 MED ORDER — PROPOFOL 10 MG/ML IV BOLUS
INTRAVENOUS | Status: DC | PRN
  Administered 2023-01-30: 70 mg via INTRAVENOUS

## 2023-01-30 MED ORDER — FENTANYL CITRATE (PF) 100 MCG/2ML IJ SOLN: 25.0000 ug | INTRAMUSCULAR | Status: DC | PRN

## 2023-01-30 MED ORDER — ONDANSETRON HCL 4 MG/2ML IJ SOLN
INTRAMUSCULAR | Status: DC | PRN
  Administered 2023-01-30: 4 mg via INTRAVENOUS

## 2023-01-30 MED ORDER — MIDAZOLAM HCL 2 MG/2ML IJ SOLN: 0.5000 mg | Freq: Once | INTRAMUSCULAR | Status: DC | PRN

## 2023-01-30 MED ORDER — MEPERIDINE HCL 25 MG/ML IJ SOLN: 6.2500 mg | INTRAMUSCULAR | Status: DC | PRN

## 2023-01-30 MED ORDER — DEXAMETHASONE SODIUM PHOSPHATE 10 MG/ML IJ SOLN
INTRAMUSCULAR | Status: AC
  Filled 2023-01-30: qty 1

## 2023-01-30 MED ORDER — EPHEDRINE SULFATE (PRESSORS) 50 MG/ML IJ SOLN
INTRAMUSCULAR | Status: DC | PRN
  Administered 2023-01-30: 5 mg via INTRAVENOUS

## 2023-01-30 MED ORDER — PROMETHAZINE HCL 25 MG/ML IJ SOLN: 6.2500 mg | INTRAMUSCULAR | Status: DC | PRN

## 2023-01-30 MED ORDER — DEXAMETHASONE SODIUM PHOSPHATE 10 MG/ML IJ SOLN
INTRAMUSCULAR | Status: DC | PRN
  Administered 2023-01-30: 5 mg via INTRAVENOUS

## 2023-01-30 MED ORDER — CEFAZOLIN SODIUM-DEXTROSE 2-4 GM/100ML-% IV SOLN
2.0000 g | INTRAVENOUS | Status: AC
  Administered 2023-01-30: 2 g via INTRAVENOUS
  Filled 2023-01-30: qty 100

## 2023-01-30 MED ORDER — LIDOCAINE 2% (20 MG/ML) 5 ML SYRINGE
INTRAMUSCULAR | Status: DC | PRN
  Administered 2023-01-30: 20 mg via INTRAVENOUS

## 2023-01-30 MED ORDER — FENTANYL CITRATE (PF) 250 MCG/5ML IJ SOLN
INTRAMUSCULAR | Status: DC | PRN
  Administered 2023-01-30: 25 ug via INTRAVENOUS

## 2023-01-30 MED ORDER — ACETAMINOPHEN 500 MG PO TABS: 1000.0000 mg | ORAL_TABLET | Freq: Once | ORAL | Status: DC

## 2023-01-30 SURGICAL SUPPLY — 32 items
ADH SKN CLS APL DERMABOND .7 (GAUZE/BANDAGES/DRESSINGS) ×1
APL PRP STRL LF DISP 70% ISPRP (MISCELLANEOUS) ×1
CHLORAPREP W/TINT 26 (MISCELLANEOUS) ×1 IMPLANT
CNTNR URN SCR LID CUP LEK RST (MISCELLANEOUS) ×1 IMPLANT
CONT SPEC 4OZ STRL OR WHT (MISCELLANEOUS) ×1
COVER SURGICAL LIGHT HANDLE (MISCELLANEOUS) ×1 IMPLANT
DERMABOND ADVANCED .7 DNX12 (GAUZE/BANDAGES/DRESSINGS) ×1 IMPLANT
DRAPE CHEST BREAST 15X10 FENES (DRAPES) ×1 IMPLANT
DRSG TEGADERM 4X4.75 (GAUZE/BANDAGES/DRESSINGS) ×1 IMPLANT
ELECT REM PT RETURN 9FT ADLT (ELECTROSURGICAL) ×1
ELECTRODE REM PT RTRN 9FT ADLT (ELECTROSURGICAL) ×1 IMPLANT
GAUZE SPONGE 4X4 12PLY STRL (GAUZE/BANDAGES/DRESSINGS) ×1 IMPLANT
GLOVE SURG SIGNA 7.5 PF LTX (GLOVE) ×1 IMPLANT
GOWN STRL REUS W/ TWL LRG LVL3 (GOWN DISPOSABLE) ×1 IMPLANT
GOWN STRL REUS W/ TWL XL LVL3 (GOWN DISPOSABLE) ×1 IMPLANT
GOWN STRL REUS W/TWL LRG LVL3 (GOWN DISPOSABLE) ×1
GOWN STRL REUS W/TWL XL LVL3 (GOWN DISPOSABLE) ×1
KIT BASIN OR (CUSTOM PROCEDURE TRAY) ×1 IMPLANT
KIT TURNOVER KIT B (KITS) ×1 IMPLANT
NDL HYPO 25GX1X1/2 BEV (NEEDLE) ×1 IMPLANT
NEEDLE HYPO 25GX1X1/2 BEV (NEEDLE) ×1 IMPLANT
NS IRRIG 1000ML POUR BTL (IV SOLUTION) ×1 IMPLANT
PACK GENERAL/GYN (CUSTOM PROCEDURE TRAY) ×1 IMPLANT
PAD ARMBOARD 7.5X6 YLW CONV (MISCELLANEOUS) ×1 IMPLANT
PENCIL SMOKE EVACUATOR (MISCELLANEOUS) ×1 IMPLANT
SPIKE FLUID TRANSFER (MISCELLANEOUS) ×1 IMPLANT
SUT MNCRL AB 4-0 PS2 18 (SUTURE) ×1 IMPLANT
SUT VIC AB 3-0 SH 27 (SUTURE) ×1
SUT VIC AB 3-0 SH 27X BRD (SUTURE) ×1 IMPLANT
SYR CONTROL 10ML LL (SYRINGE) ×1 IMPLANT
TOWEL GREEN STERILE (TOWEL DISPOSABLE) ×1 IMPLANT
TOWEL GREEN STERILE FF (TOWEL DISPOSABLE) ×1 IMPLANT

## 2023-01-30 NOTE — Discharge Instructions (Signed)
[facility] Office Phone Number [phone]  BREAST BIOPSY/ PARTIAL MASTECTOMY: POST OP INSTRUCTIONS  Always review your discharge instruction sheet given to you by the facility where your surgery was performed.  IF YOU HAVE DISABILITY OR FAMILY LEAVE FORMS, YOU MUST BRING THEM TO THE OFFICE FOR PROCESSING.  DO NOT GIVE THEM TO YOUR DOCTOR.  A prescription for pain medication may be given to you upon discharge.  Take your pain medication as prescribed, if needed.  If narcotic pain medicine is not needed, then you may take acetaminophen (Tylenol) or ibuprofen (Advil) as needed. Take your usually prescribed medications unless otherwise directed If you need a refill on your pain medication, please contact your pharmacy.  They will contact our office to request authorization.  Prescriptions will not be filled after 5pm or on week-ends. You should eat very light the first 24 hours after surgery, such as soup, crackers, pudding, etc.  Resume your normal diet the day after surgery. Most patients will experience some swelling and bruising in the breast.  Ice packs and a good support bra will help.  Swelling and bruising can take several days to resolve.  It is common to experience some constipation if taking pain medication after surgery.  Increasing fluid intake and taking a stool softener will usually help or prevent this problem from occurring.  A mild laxative (Milk of Magnesia or Miralax) should be taken according to package directions if there are no bowel movements after 48 hours. Unless discharge instructions indicate otherwise, you may remove your bandages 24-48 hours after surgery, and you may shower at that time.  You may have steri-strips (small skin tapes) in place directly over the incision.  These strips should be left on the skin for 7-10 days.  If your surgeon used skin glue on the incision, you may shower in 24 hours.  The glue will flake off over the next 2-3 weeks.  Any  sutures or staples will be removed at the office during your follow-up visit. ACTIVITIES:  You may resume regular daily activities (gradually increasing) beginning the next day.  Wearing a good support bra or sports bra minimizes pain and swelling.  You may have sexual intercourse when it is comfortable. You may drive when you no longer are taking prescription pain medication, you can comfortably wear a seatbelt, and you can safely maneuver your car and apply brakes. RETURN TO WORK:  ______________________________________________________________________________________ You should see your doctor in the office for a follow-up appointment approximately two weeks after your surgery.  Your doctor's nurse will typically make your follow-up appointment when she calls you with your pathology report.  Expect your pathology report 2-3 business days after your surgery.  You may call to check if you do not hear from us after three days. OTHER INSTRUCTIONS: _______________________________________________________________________________________________ _____________________________________________________________________________________________________________________________________ _____________________________________________________________________________________________________________________________________ _____________________________________________________________________________________________________________________________________  WHEN TO CALL YOUR DOCTOR: Fever over 101.0 Nausea and/or vomiting. Extreme swelling or bruising. Continued bleeding from incision. Increased pain, redness, or drainage from the incision.  The clinic staff is available to answer your questions during regular business hours.  Please don't hesitate to call and ask to speak to one of the nurses for clinical concerns.  If you have a medical emergency, go to the nearest emergency room or call 911.  A surgeon from [facility] is always on call at the hospital.  For further questions, please visit [email or web address]   

## 2023-01-30 NOTE — Anesthesia Postprocedure Evaluation (Signed)
Anesthesia Post Note  Patient: Nutritional therapist  Procedure(s) Performed: RE-EXCISION OF LEFT BREAST CANCER Superior margin (Left: Breast)     Patient location during evaluation: PACU Anesthesia Type: General Level of consciousness: awake and alert, patient cooperative and oriented Pain management: pain level controlled Vital Signs Assessment: post-procedure vital signs reviewed and stable Respiratory status: spontaneous breathing, nonlabored ventilation and respiratory function stable Cardiovascular status: blood pressure returned to baseline and stable Postop Assessment: no apparent nausea or vomiting and adequate PO intake Anesthetic complications: no   No notable events documented.  Last Vitals:  Vitals:   01/30/23 1200 01/30/23 1215  BP: 127/62 (!) 130/59  Pulse: 60 62  Resp: (!) 9 13  Temp:  36.4 C  SpO2: 100% 94%    Last Pain:  Vitals:   01/30/23 1215  PainSc: 0-No pain                 Atia Haupt,E. Yousef Huge

## 2023-01-30 NOTE — Transfer of Care (Signed)
Immediate Anesthesia Transfer of Care Note  Patient: Jill Jordan  Procedure(s) Performed: RE-EXCISION OF LEFT BREAST CANCER Superior margin (Left: Breast)  Patient Location: PACU  Anesthesia Type:General  Level of Consciousness: awake and alert   Airway & Oxygen Therapy: Patient Spontanous Breathing and Patient connected to nasal cannula oxygen  Post-op Assessment: Report given to RN and Post -op Vital signs reviewed and stable  Post vital signs: Reviewed and stable  Last Vitals:  Vitals Value Taken Time  BP 132/62 01/30/23 1142  Temp    Pulse 71 01/30/23 1143  Resp 12 01/30/23 1143  SpO2 97 % 01/30/23 1143  Vitals shown include unvalidated device data.  Last Pain:  Vitals:   01/30/23 0949  PainSc: 0-No pain      Patients Stated Pain Goal: 0 (01/30/23 0949)  Complications: No notable events documented.

## 2023-01-30 NOTE — Anesthesia Preprocedure Evaluation (Addendum)
Anesthesia Evaluation  Patient identified by MRN, date of birth, ID band Patient awake    Reviewed: Allergy & Precautions, NPO status , Patient's Chart, lab work & pertinent test results  History of Anesthesia Complications Negative for: history of anesthetic complications  Airway Mallampati: I  TM Distance: >3 FB     Dental  (+) Edentulous Upper, Dental Advisory Given   Pulmonary neg pulmonary ROS   breath sounds clear to auscultation       Cardiovascular hypertension, Pt. on medications (-) angina  Rhythm:Regular Rate:Normal     Neuro/Psych negative neurological ROS     GI/Hepatic negative GI ROS, Neg liver ROS,,,  Endo/Other  negative endocrine ROS    Renal/GU negative Renal ROS     Musculoskeletal   Abdominal   Peds  Hematology negative hematology ROS (+)   Anesthesia Other Findings Breast cancer  Reproductive/Obstetrics                             Anesthesia Physical Anesthesia Plan  ASA: 3  Anesthesia Plan: General   Post-op Pain Management: Tylenol PO (pre-op)*   Induction: Intravenous  PONV Risk Score and Plan: 3 and Ondansetron, Dexamethasone and Treatment may vary due to age or medical condition  Airway Management Planned: LMA  Additional Equipment: None  Intra-op Plan:   Post-operative Plan:   Informed Consent: I have reviewed the patients History and Physical, chart, labs and discussed the procedure including the risks, benefits and alternatives for the proposed anesthesia with the patient or authorized representative who has indicated his/her understanding and acceptance.     Dental advisory given  Plan Discussed with: CRNA and Surgeon  Anesthesia Plan Comments:         Anesthesia Quick Evaluation

## 2023-01-30 NOTE — Interval H&P Note (Signed)
History and Physical Interval Note: no change in H and P  01/30/2023 10:31 AM  Jill Jordan  has presented today for surgery, with the diagnosis of POSTIVE MARGIN LEFT BREAST CANCER.  The various methods of treatment have been discussed with the patient and family. After consideration of risks, benefits and other options for treatment, the patient has consented to  Procedure(s): RE-EXCISION OF LEFT BREAST CANCER (Left) as a surgical intervention.  The patient's history has been reviewed, patient examined, no change in status, stable for surgery.  I have reviewed the patient's chart and labs.  Questions were answered to the patient's satisfaction.     Abigail Miyamoto

## 2023-01-30 NOTE — Op Note (Signed)
RE-EXCISION OF LEFT BREAST CANCER Superior margin  Procedure Note  Jill Jordan 01/30/2023   Pre-op Diagnosis: Left breast cancer with positive superior margin status post resection     Post-op Diagnosis: Same  Procedure(s): RE-EXCISION OF LEFT BREAST CANCER Superior margin  Surgeon(s): Abigail Miyamoto, MD  Anesthesia: General  Staff:  Circulator: Rogers Seeds, RN Scrub Person: Truddie Coco, RN; Carmela Rima  Estimated Blood Loss: minimal               Specimens: New superior margin sent to pathology  Indications: This is an 89 year old female status post a left breast lumpectomy for large breast cancer.  Unfortunately the superior margin was positive therefore decision was made to proceed with reexcision of the superior margin.  All other margins were negative  Procedure: The patient was brought to the operating identified the correct patient.  She was placed upon the operating table and general anesthesia was induced.  Her left breast was prepped and draped in usual sterile fashion.  I anesthetized the previous scar at the 3 o'clock position with Marcaine and then made incision with a scalpel.  I took this down to the lumpectomy cavity and opened it up and drained the seroma.  I then grasped the superior margin with Allis clamps and excised the superior margin going down to the chest wall as well as to the level of the skin with the cautery.  The new superior margin was then sent to pathology for evaluation.  I achieved hemostasis with the cautery.  I anesthetized the incision further with Marcaine.  I then closed the subcutaneous tissue with interrupted 3-0 Vicryl sutures and closed the skin with a running 4-0 Monocryl.  Dermabond was then applied.  The patient tolerated the procedure well.  All the counts were correct at the end of the procedure.  The patient was then extubated in the operating room and taken in stable condition to the recovery room.          Abigail Miyamoto   Date: 01/30/2023  Time: 11:33 AM

## 2023-01-30 NOTE — Anesthesia Procedure Notes (Signed)
Procedure Name: LMA Insertion Date/Time: 01/30/2023 11:07 AM  Performed by: Alwyn Ren, CRNAPatient Re-evaluated:Patient Re-evaluated prior to induction Oxygen Delivery Method: Circle system utilized Preoxygenation: Pre-oxygenation with 100% oxygen Induction Type: IV induction LMA Size: 3.0 Number of attempts: 1 Tube secured with: Tape

## 2023-01-31 ENCOUNTER — Encounter (HOSPITAL_COMMUNITY): Payer: Self-pay | Admitting: Surgery

## 2023-01-31 LAB — SURGICAL PATHOLOGY

## 2023-02-06 ENCOUNTER — Encounter: Payer: Self-pay | Admitting: *Deleted

## 2023-02-06 DIAGNOSIS — C50412 Malignant neoplasm of upper-outer quadrant of left female breast: Secondary | ICD-10-CM

## 2023-02-11 ENCOUNTER — Telehealth: Payer: Self-pay | Admitting: Family Medicine

## 2023-02-11 NOTE — Telephone Encounter (Signed)
Called pt, no answer. Left msg for pt to call back to r/s appt due to provider being out of office.

## 2023-02-12 ENCOUNTER — Encounter: Payer: Self-pay | Admitting: *Deleted

## 2023-02-12 NOTE — Progress Notes (Addendum)
Location of Breast Cancer: Malignant neoplasm of upper-outer quadrant of left breast in female, estrogen receptor positive  Histology per Pathology Report:  01-02-23 FINAL MICROSCOPIC DIAGNOSIS:  A. 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  Negative for DCIS.     - Closest margin, invasive: Superior (involved); medial, posterior and anterior, 2.0 mm     - Closet margin, DCIS:  medial, 1.0 mm; posterior, 3.0 mm; anterior, 4.0 mm - Lymphovascular invasion (LVI): Suspicious - Calcifications: Present, rare - Prognostic markers:  ER positive (100%), PR negative (0%), Her2 negative (FISH), Ki-67 (10%) - Other: Focal fibrocystic changes including apocrine metaplasia, fibroadenomatoid change, and biopsy site changes - See oncology table and comment  INVASIVE CARCINOMA OF THE BREAST:  Resection  Procedure: Lumpectomy Specimen Laterality: Left Histologic Type: Invasive ductal carcinoma with mucinous features Histologic Grade:      Glandular (Acinar)/Tubular Differentiation: 3      Nuclear Pleomorphism: 2      Mitotic Rate: 1      Overall Grade: 2 Tumor Size: 2.8 cm (tumor involving 7 consecutive blocks) Ductal Carcinoma In Situ: Present, solid and cribriform type with focal necrosis Lymphatic and/or Vascular Invasion: Suspicious Treatment Effect in the Breast: No known presurgical therapy Margins: Superior margin positive for invasive carcinoma      Distance from Closest Margin (mm): Superior (involved); medial, posterior and anterior (2.0 mm)      Specify Closest Margin (required only if <75mm): Superior (involved); medial, posterior and anterior (2.0 mm) DCIS Margins: Uninvolved by DCIS      Distance from Closest Margin  (mm): Medial (1.0 mm); posterior (3.0 mm); anterior (4.0 mm)      Specify Closest Margin (required only if <83mm): Medial, posterior and anterior Regional Lymph Nodes: Not applicable (no lymph nodes submitted or found)      Distant Metastasis:      Distant Site(s) Involved: N/A Breast Biomarker Testing Performed on Previous Biopsy:      Testing Performed on Case Number: [id number]            Estrogen Receptor: 100%, positive, strong staining intensity            Progesterone Receptor: 0%, negative            HER2: Equivocal by IHC (2+); negative by FISH            Ki-67: 10% Pathologic Stage Classification (f, AJCC 8th Edition): pT2, pNx Representative Tumor Block: A5 Comment(s): Immunohistochemical staining for E-cadherin is performed on block A3 and shows intact membranous staining consistent with the above diagnosis. (v4.5.0.0)  01-30-23 FINAL MICROSCOPIC DIAGNOSIS:   A. BREAST, LEFT NEW SUPERIOR MARGIN, RE-EXCISION:       Benign breast tissue with stromal fibrosis, adenosis, focal usual  ductal hyperplasia and fibroadenomatous changes.       Fat necrosis and chronic inflammation consistent with prior  procedure changes.       Negative for malignancy.   Receptor Status: ER(100%), PR (0%), Her2-neu (negative by FISH ), Ki-67(10%)  Did patient present with symptoms (if so, please note symptoms) or was this found on screening mammography?: palpable lump   Past/Anticipated interventions by surgeon, if any: 01-02-23 Procedure(s): LEFT BREAST LUMPECTOMY   Surgeon(s): Abigail Miyamoto, MD  01-30-23 Procedure(s): RE-EXCISION OF LEFT  BREAST CANCER Superior margin   Surgeon(s): Abigail Miyamoto, MD  Past/Anticipated interventions by medical oncology, if any: 01-21-23 Dr. Pamelia Hoit Oncology History  Malignant neoplasm of upper-outer quadrant of left breast in female, estrogen receptor positive  12/02/2022 Initial Diagnosis    Palpable left breast lump at 2 o'clock position  measuring 2.8 cm axilla negative biopsy revealed grade 2 IDC with mucinous features ER 100%, PR 0%, Ki-67 10%, HER2 negative    12/11/2022 Cancer Staging    Staging form: Breast, AJCC 8th Edition - Clinical: Stage IIA (cT2, cN0, cM0, G2, ER+, PR-, HER2-) - Signed by Serena Croissant, MD on 12/11/2022 Histologic grading system: 3 grade system    ASSESSMENT & PLAN:  Malignant neoplasm of upper-outer quadrant of left breast in female, estrogen receptor positive (HCC) 12/02/2022:Palpable left breast lump at 2 o'clock position measuring 2.8 cm axilla negative biopsy revealed grade 2 IDC with mucinous features ER 100%, PR 0%, Ki-67 10%, HER2 negative  01/02/2023: Left lumpectomy: Grade 2 IDC with mucinous features 2.8 cm with grade 2 DCIS and LCIS, superior margin positive, ER 100%, PR 0%, HER2 negative, Ki-67 10%   Pathology counseling: I discussed the final pathology report of the patient provided  a copy of this report. I discussed the margins as well as lymph node surgeries. We also discussed the final staging along with previously performed ER/PR and HER-2/neu testing.   Treatment plan: Superior margin reexcision Adjuvant radiation therapy possible ultra fractionation Adjuvant antiestrogen therapy   Return to clinic after radiation to start antiestrogens  Lymphedema issues, if any:  none to report  Pain issues, if any:  none to report  SAFETY ISSUES: Prior radiation? no Pacemaker/ICD? no Possible current pregnancy?no Is the patient on methotrexate? no  Current Complaints / other details: Pt has no major concerns about radiation overall. She would like to get done in a week if possible. She is very independent and active helping others. 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

## 2023-02-18 ENCOUNTER — Ambulatory Visit: Payer: Medicare Other | Admitting: Family Medicine

## 2023-02-25 ENCOUNTER — Encounter: Payer: Self-pay | Admitting: Radiation Oncology

## 2023-02-25 ENCOUNTER — Telehealth: Payer: Self-pay

## 2023-02-25 NOTE — Progress Notes (Signed)
Radiation Oncology         [phone] ________________________________  Name: Jill Jordan MRN: [id number]  Date: 02/26/2023  DOB: 19-Jul-1932  Follow-Up Visit Note  Outpatient  CC: Karie Georges, MD  Serena Croissant, MD  Diagnosis:   No diagnosis found.   Stage IIA (cT2, cN0, cM0) Left Breast UOQ, Invasive and in situ ductal carcinoma, ER+ / PR- / Her2-, Grade 2  CHIEF COMPLAINT: Here to discuss management of left breast cancer  Narrative:  The patient returns today for follow-up.     Since her breast clinic consultation date of 12/11/22, she opted to proceed with a left breast lumpectomy without SLN biopsies on 01/02/23 under the care of Dr. Magnus Ivan. Pathology from the procedure revealed: tumor size of 2.8 cm; histology of grade 2 invasive ductal carcinoma with intermediate grade DCIS, focal necrosis, and possible LVI; superior margin positive for invasive carcinoma; margin status to in situ disease of 1 mm from the medial margin; ER status: 100% positive with strong staining intensity; PR status negative; Proliferation marker Ki67 at 10%; Her2 status negative; Grade 2.  Accordingly, the patient underwent re-excision of the positive margin on 01/30/23. Pathology showed no evidence of invasive or in situ carcinoma in the final superior margin. (Path did show focal UDH).   The patient has met with Dr. Pamelia Hoit and is agreeable to proceed antiestrogen therapy following XRT.  Symptomatically, the patient reports: ***        ALLERGIES:  has No Known Allergies.  Meds: Current Outpatient Medications  Medication Sig Dispense Refill   amLODipine (NORVASC) 10 MG tablet Take 1 tablet (10 mg total) by mouth daily. 90 tablet 1   cholecalciferol 25 MCG (1000 UT) tablet Take 1,000 Units by mouth daily.     Multiple Vitamins-Minerals (MULTIVITAMIN GUMMIES ADULT PO) Take by mouth daily.     traMADol (ULTRAM) 50 MG tablet Take 1 tablet (50 mg total) by mouth every 6 (six) hours as needed.  (Patient not taking: Reported on 01/29/2023) 15 tablet 0   No current facility-administered medications for this encounter.    Physical Findings:  vitals were not taken for this visit. .     General: Alert and oriented, in no acute distress HEENT: Head is normocephalic. Extraocular movements are intact. Oropharynx is clear. Neck: Neck is supple, no palpable cervical or supraclavicular lymphadenopathy. Heart: Regular in rate and rhythm with no murmurs, rubs, or gallops. Chest: Clear to auscultation bilaterally, with no rhonchi, wheezes, or rales. Abdomen: Soft, nontender, nondistended, with no rigidity or guarding. Extremities: No cyanosis or edema. Lymphatics: see Neck Exam Musculoskeletal: symmetric strength and muscle tone throughout. Neurologic: No obvious focalities. Speech is fluent.  Psychiatric: Judgment and insight are intact. Affect is appropriate. Breast exam reveals ***  Lab Findings: Lab Results  Component Value Date   WBC 5.5 01/30/2023   HGB 13.8 01/30/2023   HCT 43.4 01/30/2023   MCV 88.4 01/30/2023   PLT 333 01/30/2023    @LASTCHEMISTRY @  Radiographic Findings: No results found.  Impression/Plan: We discussed adjuvant radiotherapy today.  I recommend *** in order to ***.  I reviewed the logistics, benefits, risks, and potential side effects of this treatment in detail. Risks may include but not necessary be limited to acute and late injury tissue in the radiation fields such as skin irritation (change in color/pigmentation, itching, dryness, pain, peeling). She may experience fatigue. We also discussed possible risk of long term cosmetic changes or scar tissue. There is also a smaller risk  for lung toxicity, ***cardiac toxicity, ***brachial plexopathy, ***lymphedema, ***musculoskeletal changes, ***rib fragility or ***induction of a second malignancy, ***late chronic non-healing soft tissue wound.    The patient asked good questions which I answered to her  satisfaction. She is enthusiastic about proceeding with treatment. A consent form has been *** signed and placed in her chart.  A total of *** medically necessary complex treatment devices will be fabricated and supervised by me: *** fields with MLCs for custom blocks to protect heart, and lungs;  and, a Vac-lok. MORE COMPLEX DEVICES MAY BE MADE IN DOSIMETRY FOR FIELD IN FIELD BEAMS FOR DOSE HOMOGENEITY.  I have requested : 3D Simulation which is medically necessary to give adequate dose to at risk tissues while sparing lungs and heart.  I have requested a DVH of the following structures: lungs, heart, *** lumpectomy cavity.    The patient will receive *** Gy in *** fractions to the *** with *** fields.  This will be *** followed by a boost.  On date of service, in total, I spent *** minutes on this encounter. Patient was seen in person.  _____________________________________   Lonie Peak, MD  This document serves as a record of services personally performed by Lonie Peak, MD. It was created on her behalf by Neena Rhymes, a trained medical scribe. The creation of this record is based on the scribe's personal observations and the provider's statements to them. This document has been checked and approved by the attending provider.

## 2023-02-25 NOTE — Telephone Encounter (Signed)
Pt called to obtain meaningful use and nurse evaluation information. Consult note complete and routed to Dr. Basilio Cairo.

## 2023-02-26 ENCOUNTER — Other Ambulatory Visit: Payer: Self-pay

## 2023-02-26 ENCOUNTER — Ambulatory Visit: Payer: Medicare Other

## 2023-02-26 ENCOUNTER — Encounter: Payer: Self-pay | Admitting: Radiation Oncology

## 2023-02-26 ENCOUNTER — Ambulatory Visit
Admission: RE | Admit: 2023-02-26 | Discharge: 2023-02-26 | Disposition: A | Discharge status: Home / Self Care | Payer: Medicare Other | Source: Ambulatory Visit | Attending: Radiation Oncology | Admitting: Radiation Oncology

## 2023-02-26 VITALS — BP 154/71 | HR 75 | Temp 97.7°F | Resp 18 | Wt 110.4 lb

## 2023-02-26 DIAGNOSIS — C50412 Malignant neoplasm of upper-outer quadrant of left female breast: Secondary | ICD-10-CM | POA: Insufficient documentation

## 2023-02-26 DIAGNOSIS — Z510 Encounter for antineoplastic radiation therapy: Secondary | ICD-10-CM | POA: Diagnosis not present

## 2023-02-26 DIAGNOSIS — Z79899 Other long term (current) drug therapy: Secondary | ICD-10-CM | POA: Diagnosis not present

## 2023-02-26 DIAGNOSIS — Z170 Estrogen receptor positive status [ER+]: Secondary | ICD-10-CM | POA: Insufficient documentation

## 2023-02-26 NOTE — Addendum Note (Signed)
Encounter addended by: Sedonia Small, RN on: 02/26/2023 9:56 AM  Actions taken: Charge Capture section accepted

## 2023-03-04 DIAGNOSIS — C50412 Malignant neoplasm of upper-outer quadrant of left female breast: Secondary | ICD-10-CM | POA: Diagnosis not present

## 2023-03-05 ENCOUNTER — Encounter: Payer: Self-pay | Admitting: *Deleted

## 2023-03-05 DIAGNOSIS — Z170 Estrogen receptor positive status [ER+]: Secondary | ICD-10-CM

## 2023-03-07 ENCOUNTER — Ambulatory Visit: Payer: Medicare Other | Admitting: Family Medicine

## 2023-03-07 ENCOUNTER — Telehealth: Payer: Self-pay | Admitting: Hematology and Oncology

## 2023-03-07 ENCOUNTER — Encounter: Payer: Self-pay | Admitting: Family Medicine

## 2023-03-07 VITALS — BP 150/70 | HR 77 | Temp 97.7°F | Ht 66.0 in | Wt 110.1 lb

## 2023-03-07 DIAGNOSIS — I10 Essential (primary) hypertension: Secondary | ICD-10-CM | POA: Diagnosis not present

## 2023-03-07 NOTE — Progress Notes (Unsigned)
Established Patient Office Visit  Subjective   Patient ID: Jill Jordan, female    DOB: 1932/10/03  Age: 89 y.o. MRN: [id number]  Chief Complaint  Patient presents with   Medical Management of Chronic Issues    Patient is here for blood pressure follow up. States she was recently diagnosed with breast cancer, had to have a resection x 2 and will be starting radiation soon, will be getting 5 treatments. She reports that she is healing well, no pain, no fever or chills, no other associated symptoms.    HTN- -pt has been checking it every morning and every evening, states that it is usually in the 130's at home, states she did notice that her right ankle is a little more swollen than usually since increasing the amlodipine to 10 mg daily. She reports no dizziness, no headaches, no SOB or chest pain today, reports compliance with her medication.     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     Objective:     BP (!) 150/70   Pulse 77   Temp 97.7 F (36.5 C) (Oral)   Ht 5\' 6"  (1.676 m)   Wt 110 lb 1.6 oz (49.9 kg)   SpO2 94%   BMI 17.77 kg/m    Physical Exam Vitals reviewed.  Constitutional:      Appearance: Normal appearance. She is well-groomed and normal weight.  Eyes:     Conjunctiva/sclera: Conjunctivae normal.   Cardiovascular:     Rate and Rhythm: Normal rate and regular rhythm.     Pulses: Normal pulses.     Heart sounds: S1 normal and S2 normal.  Pulmonary:     Effort: Pulmonary effort is normal.     Breath sounds: Normal breath sounds and air entry.  Neurological:     Mental Status: She is alert and oriented to person, place, and time. Mental status is at baseline.     Gait: Gait is intact.  Psychiatric:        Mood and Affect: Mood and affect normal.        Speech: Speech normal.        Behavior: Behavior normal.      No results found for any visits on 03/07/23.    The ASCVD Risk score (Arnett DK, et al., 2019) failed to calculate for the following reasons:   The 2019 ASCVD risk score is only valid for ages 86 to 55    Assessment & Plan:  Primary hypertension Assessment & Plan: BP is office is elevated however her BP at home is controlled. I believe she has white coat syndrome in addition to HTN because her pressures at home are at goal. I will continue the 10 mg daily of amlodipine and see her back in 6 months.       Return in about 6 months (around 09/07/2023)  for HTN.    Karie Georges, MD

## 2023-03-07 NOTE — Telephone Encounter (Signed)
Spoke with patient confirming upcoming appointment  

## 2023-03-10 ENCOUNTER — Encounter: Payer: Self-pay | Admitting: Radiation Oncology

## 2023-03-10 ENCOUNTER — Ambulatory Visit: Admission: RE | Admit: 2023-03-10 | Payer: Medicare Other | Source: Ambulatory Visit

## 2023-03-10 ENCOUNTER — Ambulatory Visit: Admission: RE | Admit: 2023-03-10 | Payer: Medicare Other | Source: Ambulatory Visit | Admitting: Radiation Oncology

## 2023-03-10 NOTE — Progress Notes (Signed)
Ms. Verville had difficulty holding her breath for the breath-hold radiation technique today, so we will replan her treatment according to her free breathing CT simulation images.  Treatment was not delivered today. -----------------------------------  Lonie Peak, MD

## 2023-03-11 ENCOUNTER — Ambulatory Visit
Admission: RE | Admit: 2023-03-11 | Discharge: 2023-03-11 | Disposition: A | Discharge status: Home / Self Care | Payer: Medicare Other | Source: Ambulatory Visit | Attending: Radiation Oncology | Admitting: Radiation Oncology

## 2023-03-11 ENCOUNTER — Other Ambulatory Visit: Payer: Self-pay

## 2023-03-11 DIAGNOSIS — C50412 Malignant neoplasm of upper-outer quadrant of left female breast: Secondary | ICD-10-CM | POA: Diagnosis not present

## 2023-03-11 LAB — RAD ONC ARIA SESSION SUMMARY
Course Elapsed Days: 0
Plan Fractions Treated to Date: 1
Plan Prescribed Dose Per Fraction: 5.2 Gy
Plan Total Fractions Prescribed: 5
Plan Total Prescribed Dose: 26 Gy
Reference Point Dosage Given to Date: 5.2 Gy
Reference Point Session Dosage Given: 5.2 Gy
Session Number: 1

## 2023-03-12 ENCOUNTER — Other Ambulatory Visit: Payer: Self-pay

## 2023-03-12 ENCOUNTER — Ambulatory Visit
Admission: RE | Admit: 2023-03-12 | Discharge: 2023-03-12 | Disposition: A | Discharge status: Home / Self Care | Payer: Medicare Other | Source: Ambulatory Visit | Attending: Radiation Oncology | Admitting: Radiation Oncology

## 2023-03-12 DIAGNOSIS — C50412 Malignant neoplasm of upper-outer quadrant of left female breast: Secondary | ICD-10-CM | POA: Diagnosis not present

## 2023-03-12 LAB — RAD ONC ARIA SESSION SUMMARY
Course Elapsed Days: 1
Plan Fractions Treated to Date: 2
Plan Prescribed Dose Per Fraction: 5.2 Gy
Plan Total Fractions Prescribed: 5
Plan Total Prescribed Dose: 26 Gy
Reference Point Dosage Given to Date: 10.4 Gy
Reference Point Session Dosage Given: 5.2 Gy
Session Number: 2

## 2023-03-12 NOTE — Assessment & Plan Note (Signed)
BP is office is elevated however her BP at home is controlled. I believe she has white coat syndrome in addition to HTN because her pressures at home are at goal. I will continue the 10 mg daily of amlodipine and see her back in 6 months.

## 2023-03-13 ENCOUNTER — Other Ambulatory Visit: Payer: Self-pay

## 2023-03-13 ENCOUNTER — Ambulatory Visit
Admission: RE | Admit: 2023-03-13 | Discharge: 2023-03-13 | Disposition: A | Discharge status: Home / Self Care | Payer: Medicare Other | Source: Ambulatory Visit | Attending: Radiation Oncology | Admitting: Radiation Oncology

## 2023-03-13 DIAGNOSIS — C50412 Malignant neoplasm of upper-outer quadrant of left female breast: Secondary | ICD-10-CM | POA: Diagnosis not present

## 2023-03-13 LAB — RAD ONC ARIA SESSION SUMMARY
Course Elapsed Days: 2
Plan Fractions Treated to Date: 3
Plan Prescribed Dose Per Fraction: 5.2 Gy
Plan Total Fractions Prescribed: 5
Plan Total Prescribed Dose: 26 Gy
Reference Point Dosage Given to Date: 15.6 Gy
Reference Point Session Dosage Given: 5.2 Gy
Session Number: 3

## 2023-03-14 ENCOUNTER — Ambulatory Visit: Payer: Medicare Other

## 2023-03-14 ENCOUNTER — Other Ambulatory Visit: Payer: Self-pay

## 2023-03-14 ENCOUNTER — Ambulatory Visit
Admission: RE | Admit: 2023-03-14 | Discharge: 2023-03-14 | Disposition: A | Discharge status: Home / Self Care | Payer: Medicare Other | Source: Ambulatory Visit | Attending: Radiation Oncology | Admitting: Radiation Oncology

## 2023-03-14 DIAGNOSIS — C50412 Malignant neoplasm of upper-outer quadrant of left female breast: Secondary | ICD-10-CM | POA: Diagnosis not present

## 2023-03-14 LAB — RAD ONC ARIA SESSION SUMMARY
Course Elapsed Days: 3
Plan Fractions Treated to Date: 4
Plan Prescribed Dose Per Fraction: 5.2 Gy
Plan Total Fractions Prescribed: 5
Plan Total Prescribed Dose: 26 Gy
Reference Point Dosage Given to Date: 20.8 Gy
Reference Point Session Dosage Given: 5.2 Gy
Session Number: 4

## 2023-03-17 NOTE — Progress Notes (Signed)
Patient Care Team: Karie Georges, MD as PCP - General (Family Medicine) Abigail Miyamoto, MD as Consulting Physician (General Surgery) Serena Croissant, MD as Consulting Physician (Hematology and Oncology) Lonie Peak, MD as Attending Physician (Radiation Oncology) Donnelly Angelica, RN as Oncology Nurse Navigator Pershing Proud, RN as Oncology Nurse Navigator  DIAGNOSIS:  Encounter Diagnosis  Name Primary?   Malignant neoplasm of upper-outer quadrant of left breast in female, estrogen receptor positive (HCC) Yes    SUMMARY OF ONCOLOGIC HISTORY: Oncology History  Malignant neoplasm of upper-outer quadrant of left breast in female, estrogen receptor positive (HCC)  12/02/2022 Initial Diagnosis   Palpable left breast lump at 2 o'clock position measuring 2.8 cm axilla negative biopsy revealed grade 2 IDC with mucinous features ER 100%, PR 0%, Ki-67 10%, HER2 negative   12/11/2022 Cancer Staging   Staging form: Breast, AJCC 8th Edition - Clinical: Stage IIA (cT2, cN0, cM0, G2, ER+, PR-, HER2-) - Signed by Serena Croissant, MD on 12/11/2022 Histologic grading system: 3 grade system     CHIEF COMPLIANT: Follow-up after radiation  INTERVAL HISTORY: Jill Jordan is a 89 y.o. female is here because of recent diagnosis of left breast cancer. She presents to the clinic for a follow-up. She reports that she did good with radiation. She denies any radiation dermatitis.    ALLERGIES:  has No Known Allergies.  MEDICATIONS:  Current Outpatient Medications  Medication Sig Dispense Refill   amLODipine (NORVASC) 10 MG tablet Take 1 tablet (10 mg total) by mouth daily. 90 tablet 1   anastrozole (ARIMIDEX) 1 MG tablet Take 1 tablet (1 mg total) by mouth daily. 90 tablet 3   cholecalciferol 25 MCG (1000 UT) tablet Take 1,000 Units by mouth daily.     Multiple Vitamins-Minerals (MULTIVITAMIN GUMMIES ADULT PO) Take by mouth daily.     traMADol (ULTRAM) 50 MG tablet Take 1 tablet (50 mg total) by  mouth every 6 (six) hours as needed. 15 tablet 0   No current facility-administered medications for this visit.    PHYSICAL EXAMINATION: ECOG PERFORMANCE STATUS: 1 - Symptomatic but completely ambulatory  Vitals:   03/28/23 0826  BP: (!) 157/71  Pulse: 72  Resp: 16  Temp: (!) 97 F (36.1 C)  SpO2: 100%   Filed Weights   03/28/23 0826  Weight: 110 lb 9 oz (50.2 kg)      LABORATORY DATA:  I have reviewed the data as listed    Latest Ref Rng & Units 01/30/2023    9:39 AM 12/11/2022   12:29 PM 09/17/2022   10:58 AM  CMP  Glucose 70 - 99 mg/dL 161  096  045   BUN 8 - 23 mg/dL 14  14  12    Creatinine 0.44 - 1.00 mg/dL 4.09  8.11  9.14   Sodium 135 - 145 mmol/L 136  136  139   Potassium 3.5 - 5.1 mmol/L 4.0  3.6  4.3   Chloride 98 - 111 mmol/L 104  102  100   CO2 22 - 32 mmol/L 21  28  27    Calcium 8.9 - 10.3 mg/dL 8.8  8.8  9.2   Total Protein 6.5 - 8.1 g/dL  6.5  6.2   Total Bilirubin 0.3 - 1.2 mg/dL  0.5  0.7   Alkaline Phos 38 - 126 U/L  84  72   AST 15 - 41 U/L  10  15   ALT 0 - 44 U/L  6  12  Lab Results  Component Value Date   WBC 5.5 01/30/2023   HGB 13.8 01/30/2023   HCT 43.4 01/30/2023   MCV 88.4 01/30/2023   PLT 333 01/30/2023   NEUTROABS 4.3 12/11/2022    ASSESSMENT & PLAN:  Malignant neoplasm of upper-outer quadrant of left breast in female, estrogen receptor positive (HCC) 12/02/2022:Palpable left breast lump at 2 o'clock position measuring 2.8 cm axilla negative biopsy revealed grade 2 IDC with mucinous features ER 100%, PR 0%, Ki-67 10%, HER2 negative  01/02/2023: Left lumpectomy: Grade 2 IDC with mucinous features 2.8 cm with grade 2 DCIS and LCIS, superior margin positive, ER 100%, PR 0%, HER2 negative, Ki-67 10% 01/30/2023: Superior margin excision: Benign 03/12/2023-03/18/2023: Adjuvant radiation  Current treatment: Adjuvant antiestrogen therapy with anastrozole 1 mg daily x 1 year (1 year was chosen given her age and her  preferences)  Anastrozole counseling: We discussed the risks and benefits of anti-estrogen therapy with aromatase inhibitors. These include but not limited to insomnia, hot flashes, mood changes, vaginal dryness, bone density loss, and weight gain. We strongly believe that the benefits far outweigh the risks. Patient understands these risks and consented to starting treatment. Planned treatment duration is 5 years.  Return to clinic in 3 months for survivorship care plan visit    No orders of the defined types were placed in this encounter.  The patient has a good understanding of the overall plan. she agrees with it. she will call with any problems that may develop before the next visit here. Total time spent: 30 mins including face to face time and time spent for planning, charting and co-ordination of care   Tamsen Meek, MD 03/28/23    I Janan Ridge am acting as a Neurosurgeon for [employer]  I have reviewed the above documentation for accuracy and completeness, and I agree with the above.

## 2023-03-18 ENCOUNTER — Ambulatory Visit
Admission: RE | Admit: 2023-03-18 | Discharge: 2023-03-18 | Disposition: A | Discharge status: Home / Self Care | Payer: Medicare Other | Source: Ambulatory Visit | Attending: Radiation Oncology | Admitting: Radiation Oncology

## 2023-03-18 ENCOUNTER — Other Ambulatory Visit: Payer: Self-pay

## 2023-03-18 DIAGNOSIS — C50412 Malignant neoplasm of upper-outer quadrant of left female breast: Secondary | ICD-10-CM

## 2023-03-18 LAB — RAD ONC ARIA SESSION SUMMARY
Course Elapsed Days: 7
Plan Fractions Treated to Date: 5
Plan Prescribed Dose Per Fraction: 5.2 Gy
Plan Total Fractions Prescribed: 5
Plan Total Prescribed Dose: 26 Gy
Reference Point Dosage Given to Date: 26 Gy
Reference Point Session Dosage Given: 5.2 Gy
Session Number: 5

## 2023-03-18 MED ORDER — RADIAPLEXRX EX GEL: Freq: Once | CUTANEOUS | Status: AC

## 2023-03-25 NOTE — Radiation Completion Notes (Signed)
Patient Name: Jill Jordan, Jill Jordan MRN: [id number] Date of Birth: 05-12-1932 Referring Physician: Serena Croissant, M.D. Date of Service: 2023-03-25 Radiation Oncologist: Lonie Peak, M.D. [facility] [place]                             RADIATION ONCOLOGY END OF TREATMENT NOTE     Diagnosis: C50.412 Malignant neoplasm of upper-outer quadrant of left female breast Staging on 2022-12-11: Malignant neoplasm of upper-outer quadrant of left breast in female, estrogen receptor positive (HCC) T=cT2, N=cN0, M=cM0 Intent: Curative     ==========DELIVERED PLANS==========  First Treatment Date: 2023-03-11 - Last Treatment Date: 2023-03-18   Plan Name: Breast_L Site: Breast, Left Technique: 3D Mode: Photon Dose Per Fraction: 5.2 Gy Prescribed Dose (Delivered / Prescribed): 26 Gy / 26 Gy Prescribed Fxs (Delivered / Prescribed): 5 / 5     ==========ON TREATMENT VISIT DATES========== 2023-03-18     ==========UPCOMING VISITS==========       ==========APPENDIX - ON TREATMENT VISIT NOTES==========   See weekly On Treatment Notes in Epic for details.

## 2023-03-28 ENCOUNTER — Other Ambulatory Visit: Payer: Self-pay

## 2023-03-28 ENCOUNTER — Inpatient Hospital Stay: Payer: Medicare Other | Attending: Hematology and Oncology | Admitting: Hematology and Oncology

## 2023-03-28 VITALS — BP 157/71 | HR 72 | Temp 97.0°F | Resp 16 | Ht 66.0 in | Wt 110.6 lb

## 2023-03-28 DIAGNOSIS — C50412 Malignant neoplasm of upper-outer quadrant of left female breast: Secondary | ICD-10-CM | POA: Diagnosis present

## 2023-03-28 DIAGNOSIS — Z79811 Long term (current) use of aromatase inhibitors: Secondary | ICD-10-CM | POA: Insufficient documentation

## 2023-03-28 DIAGNOSIS — Z923 Personal history of irradiation: Secondary | ICD-10-CM | POA: Insufficient documentation

## 2023-03-28 DIAGNOSIS — Z79899 Other long term (current) drug therapy: Secondary | ICD-10-CM | POA: Diagnosis not present

## 2023-03-28 DIAGNOSIS — Z170 Estrogen receptor positive status [ER+]: Secondary | ICD-10-CM | POA: Insufficient documentation

## 2023-03-28 MED ORDER — ANASTROZOLE 1 MG PO TABS: 1.0000 mg | ORAL_TABLET | Freq: Every day | ORAL | 3 refills | Status: DC

## 2023-03-28 NOTE — Assessment & Plan Note (Addendum)
12/02/2022:Palpable left breast lump at 2 o'clock position measuring 2.8 cm axilla negative biopsy revealed grade 2 IDC with mucinous features ER 100%, PR 0%, Ki-67 10%, HER2 negative  01/02/2023: Left lumpectomy: Grade 2 IDC with mucinous features 2.8 cm with grade 2 DCIS and LCIS, superior margin positive, ER 100%, PR 0%, HER2 negative, Ki-67 10% 01/30/2023: Superior margin excision: Benign 03/12/2023-03/18/2023: Adjuvant radiation  Current treatment: Adjuvant antiestrogen therapy with anastrozole 1 mg daily x 1 year (1 year was chosen given her age and her preferences)  Anastrozole counseling: We discussed the risks and benefits of anti-estrogen therapy with aromatase inhibitors. These include but not limited to insomnia, hot flashes, mood changes, vaginal dryness, bone density loss, and weight gain. We strongly believe that the benefits far outweigh the risks. Patient understands these risks and consented to starting treatment. Planned treatment duration is 5 years.  Return to clinic in 3 months for survivorship care plan visit

## 2023-04-03 NOTE — Progress Notes (Signed)
Jill Jordan presents today for follow-up after completing radiation to her left breast on 03-18-23.   Pain: denies Skin: healing well, encouraged use of vitamin E cream for two months Fatigue: reports normal energy ROM: normal ROM Lymphedema: none to report MedOnc F/U: Lillard Anes NP on 06-30-23 Other issues of note: No major concerns or questions at this time.   Pt reports Yes No Comments  Tamoxifen []  [x]    Letrozole []  [x]    Anastrazole [x]  []  1mg   Mammogram [x]  Date:  []  Encouraged yearly mammograms

## 2023-04-17 ENCOUNTER — Ambulatory Visit
Admission: RE | Admit: 2023-04-17 | Discharge: 2023-04-17 | Disposition: A | Discharge status: Home / Self Care | Payer: Medicare Other | Source: Ambulatory Visit | Attending: Radiation Oncology | Admitting: Radiation Oncology

## 2023-04-17 ENCOUNTER — Encounter: Payer: Self-pay | Admitting: Radiation Oncology

## 2023-04-17 ENCOUNTER — Telehealth: Payer: Self-pay

## 2023-04-17 NOTE — Telephone Encounter (Signed)
Pt called for telephone follow up. Pt doing well overall with no needs. Note completed and routed to Dr. Basilio Cairo.

## 2023-05-12 ENCOUNTER — Other Ambulatory Visit: Payer: Self-pay | Admitting: Family Medicine

## 2023-05-12 DIAGNOSIS — I10 Essential (primary) hypertension: Secondary | ICD-10-CM

## 2023-06-30 ENCOUNTER — Encounter: Payer: Self-pay | Admitting: Adult Health

## 2023-06-30 ENCOUNTER — Inpatient Hospital Stay: Payer: Medicare Other | Attending: Hematology and Oncology | Admitting: Adult Health

## 2023-06-30 VITALS — BP 160/68 | HR 75 | Temp 98.1°F | Resp 18 | Ht 66.0 in | Wt 110.3 lb

## 2023-06-30 DIAGNOSIS — I10 Essential (primary) hypertension: Secondary | ICD-10-CM | POA: Diagnosis not present

## 2023-06-30 DIAGNOSIS — Z79811 Long term (current) use of aromatase inhibitors: Secondary | ICD-10-CM | POA: Diagnosis not present

## 2023-06-30 DIAGNOSIS — Z170 Estrogen receptor positive status [ER+]: Secondary | ICD-10-CM | POA: Diagnosis not present

## 2023-06-30 DIAGNOSIS — Z13820 Encounter for screening for osteoporosis: Secondary | ICD-10-CM | POA: Diagnosis not present

## 2023-06-30 DIAGNOSIS — Z923 Personal history of irradiation: Secondary | ICD-10-CM | POA: Insufficient documentation

## 2023-06-30 DIAGNOSIS — C50412 Malignant neoplasm of upper-outer quadrant of left female breast: Secondary | ICD-10-CM | POA: Insufficient documentation

## 2023-06-30 NOTE — Progress Notes (Signed)
SURVIVORSHIP VISIT:  BRIEF ONCOLOGIC HISTORY:  Oncology History  Malignant neoplasm of upper-outer quadrant of left breast in female, estrogen receptor positive (HCC)  12/02/2022 Initial Diagnosis   Palpable left breast lump at 2 o'clock position measuring 2.8 cm axilla negative biopsy revealed grade 2 IDC with mucinous features ER 100%, PR 0%, Ki-67 10%, HER2 negative   12/11/2022 Cancer Staging   Staging form: Breast, AJCC 8th Edition - Clinical: Stage IIA (cT2, cN0, cM0, G2, ER+, PR-, HER2-) - Signed by Serena Croissant, MD on 12/11/2022 Histologic grading system: 3 grade system   03/11/2023 - 03/18/2023 Radiation Therapy   Plan Name: Breast_L Site: Breast, Left Technique: 3D Mode: Photon Dose Per Fraction: 5.2 Gy Prescribed Dose (Delivered / Prescribed): 26 Gy / 26 Gy Prescribed Fxs (Delivered / Prescribed): 5 / 5   03/2023 -  Anti-estrogen oral therapy   Anastrozole     INTERVAL HISTORY:  Jill Jordan to review her survivorship care plan detailing her treatment course for breast cancer, as well as monitoring long-term side effects of that treatment, education regarding health maintenance, screening, and overall wellness and health promotion.     Overall, Jill Jordan reports feeling quite well.  Taking anastrozole daily with good tolerance.  She denies any significant issues.  REVIEW OF SYSTEMS:  Review of Systems  Constitutional:  Negative for appetite change, chills, fatigue, fever and unexpected weight change.  HENT:   Negative for hearing loss, lump/mass and trouble swallowing.   Eyes:  Negative for eye problems and icterus.  Respiratory:  Negative for chest tightness, cough and shortness of breath.   Cardiovascular:  Negative for chest pain, leg swelling and palpitations.  Gastrointestinal:  Negative for abdominal distention, abdominal pain, constipation, diarrhea, nausea and vomiting.  Endocrine: Negative for hot flashes.  Genitourinary:  Negative for difficulty urinating.    Musculoskeletal:  Negative for arthralgias.  Skin:  Negative for itching and rash.  Neurological:  Negative for dizziness, extremity weakness, headaches and numbness.  Hematological:  Negative for adenopathy. Does not bruise/bleed easily.  Psychiatric/Behavioral:  Negative for depression. The patient is not nervous/anxious.    Breast: Denies any new nodularity, masses, tenderness, nipple changes, or nipple discharge.       PAST MEDICAL/SURGICAL HISTORY:  Past Medical History:  Diagnosis Date   Chicken pox    Hypertension    Past Surgical History:  Procedure Laterality Date   BREAST BIOPSY Left 12/02/2022   [place] LT BREAST BX W LOC DEV 1ST LESION IMG BX SPEC US GUIDE 12/02/2022 [facility]AMMO[facility]   BREAST LUMPECTOMY Left 01/02/2023   Procedure: LEFT BREAST LUMPECTOMY;  Surgeon: Abigail Miyamoto, MD;  Location: [facility];  Service: General;  Laterality: Left;   BREAST SURGERY Left 1962   RE-EXCISION OF BREAST CANCER,SUPERIOR MARGINS Left 01/30/2023   Procedure: RE-EXCISION OF LEFT BREAST CANCER Superior margin;  Surgeon: Abigail Miyamoto, MD;  Location: [facility] OR;  Service: General;  Laterality: Left;   removal of small tumor left breast  1962     ALLERGIES:  No Known Allergies   CURRENT MEDICATIONS:  Outpatient Encounter Medications as of 06/30/2023  Medication Sig   amLODipine (NORVASC) 10 MG tablet TAKE 1 TABLET BY MOUTH EVERY DAY   anastrozole (ARIMIDEX) 1 MG tablet Take 1 tablet (1 mg total) by mouth daily.   cholecalciferol 25 MCG (1000 UT) tablet Take 1,000 Units by mouth daily.   Multiple Vitamins-Minerals (MULTIVITAMIN GUMMIES ADULT PO) Take by mouth daily.   traMADol (ULTRAM) 50 MG tablet Take  1 tablet (50 mg total) by mouth every 6 (six) hours as needed.   No facility-administered encounter medications on file as of 06/30/2023.     ONCOLOGIC FAMILY HISTORY:  Family History  Problem Relation Age of Onset   Alcohol abuse Other    Kidney disease Other     Hypertension Mother 24       lived to be 79   COPD Father    Heart disease Brother    Healthy Brother    Diabetes Neg Hx    Cancer Neg Hx      SOCIAL HISTORY:  Social History   Socioeconomic History   Marital status: Widowed    Spouse name: Not on file   Number of children: Not on file   Years of education: Not on file   Highest education level: Not on file  Occupational History   Not on file  Tobacco Use   Smoking status: Never   Smokeless tobacco: Never  Vaping Use   Vaping status: Never Used  Substance and Sexual Activity   Alcohol use: No   Drug use: No   Sexual activity: Not on file  Other Topics Concern   Not on file  Social History Narrative   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[employer], now [employer]      Home Situation: has lived in [place] all her life, lives with son      Spiritual Beliefs: baptist      Lifestyle: walks 1 hour per day, also mows the the lawn and takes care of the chickens, eats healthy               Social Determinants of Health   Financial Resource Strain: Not on file  Food Insecurity: No Food Insecurity (02/25/2023)   Hunger Vital Sign    Worried About Running Out of Food in the Last Year: Never true    Ran Out of Food in the Last Year: Never true  Transportation Needs: No Transportation Needs (02/25/2023)   PRAPARE - Administrator, Civil Service (Medical): No    Lack of Transportation (Non-Medical): No  Physical Activity: Not on file  Stress: Not on file  Social Connections: Not on file  Intimate Partner Violence: Not At Risk (02/25/2023)   Humiliation, Afraid, Rape, and Kick questionnaire    Fear of Current or Ex-Partner: No    Emotionally Abused: No    Physically Abused: No    Sexually Abused: No     OBSERVATIONS/OBJECTIVE:  BP (!) 160/68 (BP Location: Left Arm, Patient Position: Sitting) Comment: 153/66 LA  Pulse 75   Temp 98.1 F (36.7 C) (Tympanic)   Resp 18   Ht 5\' 6"  (1.676 m)   Wt 110 lb  4.8 oz (50 kg)   SpO2 99%   BMI 17.80 kg/m  GENERAL: Patient is a well appearing female in no acute distress HEENT:  Sclerae anicteric.  Oropharynx clear and moist. No ulcerations or evidence of oropharyngeal candidiasis. Neck is supple.  NODES:  No cervical, supraclavicular, or axillary lymphadenopathy palpated.  BREAST EXAM:  left breast s/p lumpectomy and radiation, no sign of local recurrence right breast is benign. LUNGS:  Clear to auscultation bilaterally.  No wheezes or rhonchi. HEART:  Regular rate and rhythm. No murmur appreciated. ABDOMEN:  Soft, nontender.  Positive, normoactive bowel sounds. No organomegaly palpated. MSK:  No focal spinal tenderness to palpation. Full range of motion bilaterally in the upper extremities. EXTREMITIES:  No peripheral edema.  SKIN:  Clear with no obvious rashes or skin changes. No nail dyscrasia. NEURO:  Nonfocal. Well oriented.  Appropriate affect.   LABORATORY DATA:  None for this visit.  DIAGNOSTIC IMAGING:  None for this visit.   ASSESSMENT AND PLAN:  Ms.. Jordan is a pleasant 89 y.o. female with Stage IIA left breast invasive ductal carcinoma, ER+/PR-/HER2-, diagnosed in 11/2022, treated with lumpectomy, adjuvant radiation therapy, and anti-estrogen therapy with Anastrozole beginning in 03/2023.  She presents to the Survivorship Clinic for our initial meeting and routine follow-up post-completion of treatment for breast cancer.    1. Stage IIA left breast cancer:  Ms. Hammons is continuing to recover from definitive treatment for breast cancer. She will follow-up with her medical oncologist, Dr.  Pamelia Hoit in 6 months with history and physical exam per surveillance protocol.  She will continue her anti-estrogen therapy with Anastrozole. Thus far, she is tolerating the Anastrozole well, with minimal side effects. Her mammogram is due 11/2023; orders placed today.   Today, a comprehensive survivorship care plan and treatment summary was reviewed with  the patient today detailing her breast cancer diagnosis, treatment course, potential late/long-term effects of treatment, appropriate follow-up care with recommendations for the future, and patient education resources.  A copy of this summary, along with a letter will be sent to the patient's primary care provider via mail/fax/In Basket message after today's visit.    2. Bone health:  Given Ms. Willet's age/history of breast cancer and her current treatment regimen including anti-estrogen therapy with Anastrozole, she is at risk for bone demineralization.  She has not undergone bone density testing and I ordered this to occur in the next few weeks at [facility].  She was given education on specific activities to promote bone health.  3. Cancer screening:  Due to Ms. Svec's history and her age, she should receive screening for skin cancers.  The information and recommendations are listed on the patient's comprehensive care plan/treatment summary and were reviewed in detail with the patient.    4. Health maintenance and wellness promotion: Ms. Abernathey was encouraged to consume 5-7 servings of fruits and vegetables per day. We reviewed the "Nutrition Rainbow" handout.  She was also encouraged to engage in moderate to vigorous exercise for 30 minutes per day most days of the week.  She was instructed to limit her alcohol consumption and continue to abstain from tobacco use.     5. Support services/counseling: It is not uncommon for this period of the patient's cancer care trajectory to be one of many emotions and stressors.   She was given information regarding our available services and encouraged to contact me with any questions or for help enrolling in any of our support group/programs.    Follow up instructions:    -Return to cancer center in 6 months for follow-up with Dr. Pamelia Hoit -Mammogram due in 10/2023 -Bone density testing in the next few weeks -She is welcome to return back to the  Survivorship Clinic at any time; no additional follow-up needed at this time.  -Consider referral back to survivorship as a long-term survivor for continued surveillance  The patient was provided an opportunity to ask questions and all were answered. The patient agreed with the plan and demonstrated an understanding of the instructions.   Total encounter time:45 minutes*in face-to-face visit time, chart review, lab review, care coordination, order entry, and documentation of the encounter time.    Lillard Anes, NP 06/30/23 1:51 PM Medical Oncology and Hematology [facility] [address] [place], [place] [address] Tel. [phone]    Fax. [phone]  *Total Encounter Time as defined by the Centers for Medicare and Medicaid Services includes, in addition to the face-to-face time of a patient visit (documented in the note above) non-face-to-face time: obtaining and reviewing outside history, ordering and reviewing medications, tests or procedures, care coordination (communications with other health care professionals or caregivers) and documentation in the medical record.

## 2023-09-12 ENCOUNTER — Ambulatory Visit: Payer: Medicare Other | Admitting: Family Medicine

## 2023-09-12 ENCOUNTER — Other Ambulatory Visit: Payer: Self-pay | Admitting: Family Medicine

## 2023-09-12 ENCOUNTER — Encounter: Payer: Self-pay | Admitting: Family Medicine

## 2023-09-12 VITALS — BP 150/60 | HR 90 | Temp 98.2°F | Ht 66.0 in | Wt 111.9 lb

## 2023-09-12 DIAGNOSIS — I10 Essential (primary) hypertension: Secondary | ICD-10-CM | POA: Diagnosis not present

## 2023-09-12 MED ORDER — FUROSEMIDE 20 MG PO TABS
10.0000 mg | ORAL_TABLET | Freq: Every day | ORAL | 3 refills | Status: DC | PRN
Start: 2023-09-12 — End: 2023-09-12

## 2023-09-12 MED ORDER — AMLODIPINE BESYLATE 10 MG PO TABS: 10.0000 mg | ORAL_TABLET | Freq: Every day | ORAL | 1 refills | Status: DC

## 2023-09-12 NOTE — Progress Notes (Signed)
Established Patient Office Visit  Subjective   Patient ID: Jill Jordan, female    DOB: January 03, 1932  Age: 89 y.o. MRN: [id number]  Chief Complaint  Patient presents with   Medical Management of Chronic Issues   Patient brought in handicapped placard for completion    Pt is here for follow up on her blood pressure. She reports she is checking her blood pressure every morning. States she gets nervous when she comes to the doctor and her blood pressure goes up. States she is getting 140's usually at home. Is reporting that her right ankle is more swollen than usual. States sometimes her left ankle also swells sometimes. States that she gets good physical activity daily, is eating healthy and she does not have any new symptoms or issues to report.     Current Outpatient Medications  Medication Instructions   amLODipine (NORVASC) 10 mg, Oral, Daily   anastrozole (ARIMIDEX) 1 mg, Oral, Daily   cholecalciferol (VITAMIN D3) 1,000 Units, Oral, Daily   furosemide (LASIX) 10-20 mg, Oral, Daily PRN   Multiple Vitamins-Minerals (MULTIVITAMIN GUMMIES ADULT PO) Oral, Daily   traMADol (ULTRAM) 50 mg, Oral, Every 6 hours PRN    Patient Active Problem List   Diagnosis Date Noted   Malignant neoplasm of upper-outer quadrant of left breast in female, estrogen receptor positive (HCC) 12/09/2022   HTN (hypertension) 09/19/2022   Breast mass, left 09/10/2022   White coat hypertension 03/31/2013   ELEVATED BLOOD PRESSURE WITHOUT DIAGNOSIS OF HYPERTENSION 03/31/2008      Review of Systems  All other systems reviewed and are negative.     Objective:     BP (!) 150/60 (BP Location: Right Arm, Patient Position: Sitting, Cuff Size: Normal)   Pulse 90   Temp 98.2 F (36.8 C) (Oral)   Ht 5\' 6"  (1.676 m)   Wt 111 lb 14.4 oz (50.8 kg)   SpO2 97%   BMI 18.06 kg/m    Physical Exam Vitals reviewed.  Constitutional:      Appearance: Normal appearance. She is well-groomed and normal weight.   Eyes:     Conjunctiva/sclera: Conjunctivae normal.  Neck:     Thyroid: No thyromegaly.  Cardiovascular:     Rate and Rhythm: Normal rate and regular rhythm.     Pulses: Normal pulses.     Heart sounds: S1 normal and S2 normal.  Pulmonary:     Effort: Pulmonary effort is normal.     Breath sounds: Normal breath sounds and air entry.  Abdominal:     General: Bowel sounds are normal.  Musculoskeletal:     Right lower leg: No edema.     Left lower leg: No edema.  Neurological:     Mental Status: She is alert and oriented to person, place, and time. Mental status is at baseline.     Gait: Gait is intact.  Psychiatric:        Mood and Affect: Mood and affect normal.        Speech: Speech normal.        Behavior: Behavior normal.        Judgment: Judgment normal.      No results found for any visits on 09/12/23.    The ASCVD Risk score (Arnett DK, et al., 2019) failed to calculate for the following reasons:   The 2019 ASCVD risk score is only valid for ages 87 to 53    Assessment & Plan:  Primary hypertension Assessment & Plan: Pt reports she  is getting 140's systolic at home. In the office she remains elevated with multiple checks. I advised that she continue to monitor her blood pressure at home and we discussed the warning signs of HTN indlucing dizziness, headaches, and neurologic signs. RTC in 5 months for her follow up visit. Refilled her medications today, continue below as prescribed.   Orders: -     amLODIPine Besylate; Take 1 tablet (10 mg total) by mouth daily.  Dispense: 90 tablet; Refill: 1     Return in about 6 months (around 03/11/2024) for HTN.    Karie Georges, MD

## 2023-09-12 NOTE — Assessment & Plan Note (Addendum)
Pt reports she is getting 140's systolic at home. In the office she remains elevated with multiple checks. I advised that she continue to monitor her blood pressure at home and we discussed the warning signs of HTN indlucing dizziness, headaches, and neurologic signs. RTC in 5 months for her follow up visit. Refilled her medications today, continue below as prescribed.

## 2023-09-12 NOTE — Patient Instructions (Signed)
[facility] [address] [place], [place], [place] [address] [phone]

## 2023-10-23 ENCOUNTER — Ambulatory Visit
Admission: RE | Admit: 2023-10-23 | Discharge: 2023-10-23 | Disposition: A | Discharge status: Home / Self Care | Payer: Medicare Other | Source: Ambulatory Visit | Attending: Adult Health | Admitting: Adult Health

## 2023-10-23 DIAGNOSIS — C50412 Malignant neoplasm of upper-outer quadrant of left female breast: Secondary | ICD-10-CM

## 2024-03-25 ENCOUNTER — Ambulatory Visit: Admitting: Family Medicine

## 2024-03-25 ENCOUNTER — Other Ambulatory Visit: Payer: Self-pay | Admitting: Hematology and Oncology

## 2024-03-25 ENCOUNTER — Encounter: Payer: Self-pay | Admitting: Family Medicine

## 2024-03-25 DIAGNOSIS — Z0000 Encounter for general adult medical examination without abnormal findings: Secondary | ICD-10-CM | POA: Diagnosis not present

## 2024-03-25 NOTE — Progress Notes (Signed)
 Patient unable to obtain vital signs due to telehealth visit

## 2024-03-25 NOTE — Progress Notes (Signed)
 PATIENT CHECK-IN and HEALTH RISK ASSESSMENT QUESTIONNAIRE:  -completed by phone/video for upcoming Medicare Preventive Visit  Pre-Visit Check-in: 1)Vitals (height, wt, BP, etc) - record in vitals section for visit on day of visit Request home vitals (wt, BP, etc.) and enter into vitals, THEN update Vital Signs SmartPhrase below at the top of the HPI. See below.  2)Review and Update Medications, Allergies PMH, Surgeries, Social history in Epic 3)Hospitalizations in the last year with date/reason? no  4)Review and Update Care Team (patient's specialists) in Epic 5) Complete PHQ9 in Epic  6) Complete Fall Screening in Epic 7)Review all Health Maintenance Due and order under PCP if not done.  Medicare Wellness Patient Questionnaire:  Answer theses question about your habits: How often do you have a drink containing alcohol?n/a How many drinks containing alcohol do you have on a typical day when you are drinking?n/a Have you ever smoked?no Quit date if applicable? N/a  How many packs a day do/did you smoke? N/a Do you use smokeless tobacco?no Do you use an illicit drugs?no On average, how many days per week do you engage in moderate to strenuous exercise (like a brisk walk)?2 days - but has a garden and works in her garden for 1 hour per day and does all of her own yard work On average, how many minutes do you engage in exercise at this level? 10 mins  Are you sexually active? No Number of partners? N/a Reports home cooked food and usually lots of veggies Typical breakfast: Varies  Typical lunch: Varies  Typical dinner: Varies  Typical snacks: Crackers and fruit   Beverages: water, Soda  Is making her granddaughter a Garment/textile technologist, has children and grandchildren around.   Answer theses question about your everyday activities: Can you perform most household chores? Yes  Are you deaf or have significant trouble hearing? Sometimes  Do you feel that you have a problem with memory? no Do you feel  safe at home? Yes  Last dentist visit? N/a 8. Do you have any difficulty performing your everyday activities?no Are you having any difficulty walking, taking medications on your own, and or difficulty managing daily home needs?no  Do you have difficulty walking or climbing stairs?no Do you have difficulty dressing or bathing? no Do you have difficulty doing errands alone such as visiting a doctor's office or shopping?no Do you currently have any difficulty preparing food and eating?no Do you currently have any difficulty using the toilet?no  Do you have any difficulty managing your finances?no Do you have any difficulties with housekeeping of managing your housekeeping?no   Do you have Advanced Directives in place (Living Will, Healthcare Power or Attorney)? no   Last eye Exam and location? 2 years ago    Do you currently use prescribed or non-prescribed narcotic or opioid pain medications? no  Do you have a history or close family history of breast, ovarian, tubal or peritoneal cancer or a family member with BRCA (breast cancer susceptibility 1 and 2) gene mutations?No   Nurse/Assistant Credentials/time stamp: MG 4:32     ----------------------------------------------------------------------------------------------------------------------------------------------------------------------------------------------------------------------  Because this visit was a virtual/telehealth visit, some criteria may be missing or patient reported. Any vitals not documented were not able to be obtained and vitals that have been documented are patient reported.    MEDICARE ANNUAL PREVENTIVE VISIT WITH PROVIDER: (Welcome to Medicare, initial annual wellness or annual wellness exam)  Virtual Visit via Phone Note  I connected with Jill Jordan on 03/25/24 by phone  and verified that  I am speaking with the correct person using two identifiers.  Location patient: home Location provider:work or  home office Persons participating in the virtual visit: patient, provider  Concerns and/or follow up today: reports is doing well, no concerns. Reports was told in May that she was cancer free.    See HM section in Epic for other details of completed HM.    ROS: negative for report of fevers, unintentional weight loss, vision changes, vision loss, hearing loss or change, chest pain, sob, hemoptysis, melena, hematochezia, hematuria, falls, bleeding or bruising, thoughts of suicide or self harm, memory loss  Patient-completed extensive health risk assessment - reviewed and discussed with the patient: See Health Risk Assessment completed with patient prior to the visit either above or in recent phone note. This was reviewed in detailed with the patient today and appropriate recommendations, orders and referrals were placed as needed per Summary below and patient instructions.   Review of Medical History: -PMH, PSH, Family History and current specialty and care providers reviewed and updated and listed below   Patient Care Team: Aida House, MD as PCP - General (Family Medicine) Oza Blumenthal, MD as Consulting Physician (General Surgery) Cameron Cea, MD as Consulting Physician (Hematology and Oncology) Colie Dawes, MD as Attending Physician (Radiation Oncology)   Past Medical History:  Diagnosis Date   Chicken pox    Hypertension     Past Surgical History:  Procedure Laterality Date   BREAST BIOPSY Left 12/02/2022   US  LT BREAST BX W LOC DEV 1ST LESION IMG BX SPEC US  GUIDE 12/02/2022 [facility]   BREAST LUMPECTOMY Left 01/02/2023   Procedure: LEFT BREAST LUMPECTOMY;  Surgeon: Oza Blumenthal, MD;  Location: [facility];  Service: General;  Laterality: Left;   BREAST SURGERY Left 1962   RE-EXCISION OF BREAST CANCER,SUPERIOR MARGINS Left 01/30/2023   Procedure: RE-EXCISION OF LEFT BREAST CANCER Superior margin;  Surgeon: Oza Blumenthal, MD;   Location: [facility] OR;  Service: General;  Laterality: Left;   removal of small tumor left breast  1962    Social History   Socioeconomic History   Marital status: Widowed    Spouse name: Not on file   Number of children: Not on file   Years of education: Not on file   Highest education level: Not on file  Occupational History   Not on file  Tobacco Use   Smoking status: Never   Smokeless tobacco: Never  Vaping Use   Vaping status: Never Used  Substance and Sexual Activity   Alcohol use: No   Drug use: No   Sexual activity: Not on file  Other Topics Concern   Not on file  Social History Narrative   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Print production planner, now Lexicographer houses      Home Situation: has lived in [place] all her life, lives with son      Spiritual Beliefs: baptist      Lifestyle: walks 1 hour per day, also mows the the lawn and takes care of the chickens, eats healthy               Social Drivers of Health   Financial Resource Strain: Low Risk  (03/25/2024)   Overall Financial Resource Strain (CARDIA)    Difficulty of Paying Living Expenses: Not hard at all  Food Insecurity: No Food Insecurity (03/25/2024)   Hunger Vital Sign    Worried About Running Out of Food in the Last Year: Never true  Ran Out of Food in the Last Year: Never true  Transportation Needs: No Transportation Needs (03/25/2024)   PRAPARE - Administrator, Civil Service (Medical): No    Lack of Transportation (Non-Medical): No  Physical Activity: Insufficiently Active (03/25/2024)   Exercise Vital Sign    Days of Exercise per Week: 2 days    Minutes of Exercise per Session: 10 min  Stress: No Stress Concern Present (03/25/2024)   [employer] of Occupational Health - Occupational Stress Questionnaire    Feeling of Stress : Not at all  Social Connections: Socially Isolated (03/25/2024)   Social Connection and Isolation Panel [NHANES]    Frequency of Communication with Friends and Family:  More than three times a week    Frequency of Social Gatherings with Friends and Family: More than three times a week    Attends Religious Services: Never    Database administrator or Organizations: No    Attends Banker Meetings: Never    Marital Status: Widowed  Intimate Partner Violence: Not At Risk (03/25/2024)   Humiliation, Afraid, Rape, and Kick questionnaire    Fear of Current or Ex-Partner: No    Emotionally Abused: No    Physically Abused: No    Sexually Abused: No    Family History  Problem Relation Age of Onset   Alcohol abuse Other    Kidney disease Other    Hypertension Mother 95       lived to be 22   COPD Father    Heart disease Brother    Healthy Brother    Diabetes Neg Hx    Cancer Neg Hx     Current Outpatient Medications on File Prior to Visit  Medication Sig Dispense Refill   amLODipine  (NORVASC ) 10 MG tablet Take 1 tablet (10 mg total) by mouth daily. 90 tablet 1   cholecalciferol 25 MCG (1000 UT) tablet Take 1,000 Units by mouth daily.     furosemide  (LASIX ) 20 MG tablet TAKE 0.5-1 TABLETS (10-20 MG TOTAL) BY MOUTH DAILY AS NEEDED. 90 tablet 1   Multiple Vitamins-Minerals (MULTIVITAMIN GUMMIES ADULT PO) Take by mouth daily.     traMADol  (ULTRAM ) 50 MG tablet Take 1 tablet (50 mg total) by mouth every 6 (six) hours as needed. 15 tablet 0   No current facility-administered medications on file prior to visit.    No Known Allergies     Physical Exam Vitals requested from patient and listed below if patient had equipment and was able to obtain at home for this virtual visit: There were no vitals filed for this visit. Estimated body mass index is 18.06 kg/m as calculated from the following:   Height as of 09/12/23: 5\' 6"  (1.676 m).   Weight as of 09/12/23: 111 lb 14.4 oz (50.8 kg).  EKG (optional): deferred due to virtual visit  GENERAL: alert, oriented, no acute distress detected, full vision exam deferred due to pandemic and/or virtual  encounter  PSYCH/NEURO: pleasant and cooperative, no obvious depression or anxiety, speech and thought processing grossly intact, Cognitive function grossly intact  Flowsheet Row Clinical Support from 03/25/2024 in [facility]  PHQ-9 Total Score 0           03/25/2024    4:20 PM 12/17/2022    2:28 PM 09/10/2022   11:24 AM 04/17/2020   12:46 PM 09/27/2013    9:50 AM  Depression screen PHQ 2/9  Decreased Interest 0 0 0 0 0  Down, Depressed, Hopeless 0 0 0 0 0  PHQ - 2 Score 0 0 0 0 0  Altered sleeping 0 0 0    Tired, decreased energy 0 0 0    Change in appetite 0 0 0    Feeling bad or failure about yourself  0 0 0    Trouble concentrating 0 0 0    Moving slowly or fidgety/restless 0 0 0    Suicidal thoughts 0 0 0    PHQ-9 Score 0 0 0    Difficult doing work/chores Not difficult at all Not difficult at all          09/10/2022   11:27 AM 09/17/2022   10:45 AM 09/17/2022    3:58 PM 11/19/2022   10:49 AM 03/25/2024    4:20 PM  Fall Risk  Falls in the past year? 0   0 0  Was there an injury with Fall? 0   0 0  Fall Risk Category Calculator 0   0 0  Fall Risk Category (Retired) Low      (RETIRED) Patient Fall Risk Level Low fall risk Low fall risk Low fall risk    Patient at Risk for Falls Due to No Fall Risks   No Fall Risks No Fall Risks  Fall risk Follow up Falls evaluation completed    Falls evaluation completed     SUMMARY AND PLAN:  Encounter for Medicare annual wellness exam  Discussed applicable health maintenance/preventive health measures and advised and referred or ordered per patient preferences: -reports she had all of the pneumonia vaccines -she declines the other vaccines -dexa is scheduled Health Maintenance  Topic Date Due   DEXA SCAN  Never done   Pneumonia Vaccine 16+ Years old (2 of 2 - PCV) 12/18/2013   COVID-19 Vaccine (3 - Pfizer risk series) 04/10/2024 (Originally 02/02/2020)   Zoster Vaccines- Shingrix (1 of 2)  06/25/2024 (Originally 10/30/1950)   DTaP/Tdap/Td (2 - Td or Tdap) 03/25/2025 (Originally 12/18/2022)   INFLUENZA VACCINE  05/21/2024   MAMMOGRAM  10/22/2024   Medicare Annual Wellness (AWV)  03/25/2025   HPV VACCINES  Aged Out   Meningococcal B Vaccine  Aged Out    Education and counseling on the following was provided based on the above review of health and a plan/checklist for the patient, along with additional information discussed, was provided for the patient in the patient instructions :  -Advised on importance of completing advanced directives, discussed options for completing and provided information in patient instructions as well -Advised and counseled on a healthy lifestyle - including the importance of a healthy diet, regular physical activity, social connections -Reviewed patient's current diet. Advised and counseled on a whole foods based healthy diet. A summary of a healthy diet was provided in the Patient Instructions.  -reviewed patient's current physical activity level and discussed exercise guidelines for adults. Congratulated on staying active and encouraged to continue.  -Advise yearly dental visits at minimum and regular eye exams   Follow up: see patient instructions     Patient Instructions  I really enjoyed getting to talk with you today! I am available on Tuesdays and Thursdays for virtual visits if you have any questions or concerns, or if I can be of any further assistance.   CHECKLIST FROM ANNUAL WELLNESS VISIT:  -Follow up (please call to schedule if not scheduled after visit):   -yearly for annual wellness visit with primary care office  Here is a list of your preventive  care/health maintenance measures and the plan for each if any are due:  PLAN For any measures below that may be due:   Health Maintenance  Topic Date Due   Zoster Vaccines- Shingrix (1 of 2) Never done   DEXA SCAN  Never done   Pneumonia Vaccine 9+ Years old (2 of 2 - PCV)  12/18/2013   COVID-19 Vaccine (3 - Pfizer risk series) 02/02/2020   DTaP/Tdap/Td (2 - Td or Tdap) 12/18/2022   INFLUENZA VACCINE  05/21/2024   MAMMOGRAM  10/22/2024   Medicare Annual Wellness (AWV)  03/25/2025   HPV VACCINES  Aged Out   Meningococcal B Vaccine  Aged Out    -See a dentist at least yearly  -Get your eyes checked and then per your eye specialist's recommendations  -Other issues addressed today:   -I have included below further information regarding a healthy whole foods based diet, physical activity guidelines for adults, stress management and opportunities for social connections. I hope you find this information useful.   -----------------------------------------------------------------------------------------------------------------------------------------------------------------------------------------------------------------------------------------------------------    NUTRITION: -eat real food: lots of colorful vegetables (half the plate) and fruits -5-7 servings of vegetables and fruits per day (fresh or steamed is best), exp. 2 servings of vegetables with lunch and dinner and 2 servings of fruit per day. Berries and greens such as kale and collards are great choices.  -consume on a regular basis:  fresh fruits, fresh veggies, fish, nuts, seeds, healthy oils (such as olive oil, avocado oil), whole grains (make sure for bread/pasta/crackers/etc., that the first ingredient on label contains the word "whole"), legumes. -can eat small amounts of dairy and lean meat (no larger than the palm of your hand), but avoid processed meats such as ham, bacon, lunch meat, etc. -drink water -try to avoid fast food and pre-packaged foods, processed meat, ultra processed foods/beverages (donuts, candy, etc.) -most experts advise limiting sodium to < 2300mg  per day, should limit further is any chronic conditions such as high blood pressure, heart disease, diabetes, etc. The American  Heart Association advised that < 1500mg  is is ideal -try to avoid foods/beverages that contain any ingredients with names you do not recognize  -try to avoid foods/beverages  with added sugar or sweeteners/sweets  -try to avoid sweet drinks (including diet drinks): soda, juice, Gatorade, sweet tea, power drinks, diet drinks -try to avoid white rice, white bread, pasta (unless whole grain)  EXERCISE GUIDELINES FOR ADULTS: -if you wish to increase your physical activity, do so gradually and with the approval of your doctor -STOP and seek medical care immediately if you have any chest pain, chest discomfort or trouble breathing when starting or increasing exercise  -move and stretch your body, legs, feet and arms when sitting for long periods -Physical activity guidelines for optimal health in adults: -get at least 150 minutes per week of moderate exercise (can talk, but not sing); this is about 20-30 minutes of sustained activity 5-7 days per week or two 10-15 minute episodes of sustained activity 5-7 days per week -do some muscle building/resistance training/strength training at least 2 days per week  -balance exercises 3+ days per week:   Stand somewhere where you have something sturdy to hold onto if you lose balance    1) lift up on toes, then back down, start with 5x per day and work up to 20x   2) stand and lift one leg straight out to the side so that foot is a few inches of the floor, start with 5x each  side and work up to 20x each side   3) stand on one foot, start with 5 seconds each side and work up to 20 seconds on each side  If you need ideas or help with getting more active:  -Silver sneakers https://tools.silversneakers.com  -Walk with a Doc: http://www.duncan-williams.com/  -try to include resistance (weight lifting/strength building) and balance exercises twice per week: or the following link for  ideas: http://castillo-powell.com/  BuyDucts.dk  STRESS MANAGEMENT: -can try meditating, or just sitting quietly with deep breathing while intentionally relaxing all parts of your body for 5 minutes daily -if you need further help with stress, anxiety or depression please follow up with your primary doctor or contact the wonderful folks at [facility]: [phone]  SOCIAL CONNECTIONS: -options in [place] if you wish to engage in more social and exercise related activities:  -Silver sneakers https://tools.silversneakers.com  -Walk with a Doc: http://www.duncan-williams.com/  -Check out the [facility] Active Adults 50+ section on the [place] of [employer] (hiking clubs, book clubs, cards and games, chess, exercise classes, aquatic classes and much more) - see the website for details: https://www.[place].gov/departments/parks-recreation/active-adults50  -YouTube has lots of exercise videos for different ages and abilities as well  -[facility] (a variety of indoor and outdoor inperson activities for adults). ([phone]. [address]  -Virtual Online Classes (a variety of topics): see seniorplanet.org or call [phone]  -consider volunteering at a school, hospice center, church, senior center or elsewhere     ADVANCED HEALTHCARE DIRECTIVES:  [place] Advanced Directives assistance:   ExpressWeek.com.cy  Everyone should have advanced health care directives in place. This is so that you get the care you want, should you ever be in a situation where you are unable to make your own medical decisions.   From the [place] Advanced Directive Website: "Advance Health Care Directives are legal documents in which you give written instructions about your health care if, in the future, you cannot speak for  yourself.   A health care power of attorney allows you to name a person you trust to make your health care decisions if you cannot make them yourself. A declaration of a desire for a natural death (or living will) is document, which states that you desire not to have your life prolonged by extraordinary measures if you have a terminal or incurable illness or if you are in a vegetative state. An advance instruction for mental health treatment makes a declaration of instructions, information and preferences regarding your mental health treatment. It also states that you are aware that the advance instruction authorizes a mental health treatment provider to act according to your wishes. It may also outline your consent or refusal of mental health treatment. A declaration of an anatomical gift allows anyone over the age of 29 to make a gift by will, organ donor card or other document."   Please see the following website or an elder law attorney for forms, FAQs and for completion of advanced directives: [place]  Print production planner Health Care Directives Advance Health Care Directives (http://guzman.com/)  Or copy and paste the following to your web browser: [email or web address]         Maurie Southern, DO

## 2024-03-25 NOTE — Patient Instructions (Addendum)
 I really enjoyed getting to talk with you today! I am available on Tuesdays and Thursdays for virtual visits if you have any questions or concerns, or if I can be of any further assistance.   CHECKLIST FROM ANNUAL WELLNESS VISIT:  -Follow up (please call to schedule if not scheduled after visit):   -yearly for annual wellness visit with primary care office  Here is a list of your preventive care/health maintenance measures and the plan for each if any are due:  PLAN For any measures below that may be due:   Health Maintenance  Topic Date Due   DEXA SCAN  Never done   Pneumonia Vaccine 59+ Years old (2 of 2 - PCV) 12/18/2013   COVID-19 Vaccine (3 - Pfizer risk series) 04/10/2024 (Originally 02/02/2020)   Zoster Vaccines- Shingrix (1 of 2) 06/25/2024 (Originally 10/30/1950)   DTaP/Tdap/Td (2 - Td or Tdap) 03/25/2025 (Originally 12/18/2022)   INFLUENZA VACCINE  05/21/2024   MAMMOGRAM  10/22/2024   Medicare Annual Wellness (AWV)  03/25/2025   HPV VACCINES  Aged Out   Meningococcal B Vaccine  Aged Out    -See a dentist at least yearly  -Get your eyes checked and then per your eye specialist's recommendations  -Other issues addressed today:   -I have included below further information regarding a healthy whole foods based diet, physical activity guidelines for adults, stress management and opportunities for social connections. I hope you find this information useful.   -----------------------------------------------------------------------------------------------------------------------------------------------------------------------------------------------------------------------------------------------------------    NUTRITION: -eat real food: lots of colorful vegetables (half the plate) and fruits -5-7 servings of vegetables and fruits per day (fresh or steamed is best), exp. 2 servings of vegetables with lunch and dinner and 2 servings of fruit per day. Berries and greens such as  kale and collards are great choices.  -consume on a regular basis:  fresh fruits, fresh veggies, fish, nuts, seeds, healthy oils (such as olive oil, avocado oil), whole grains (make sure for bread/pasta/crackers/etc., that the first ingredient on label contains the word "whole"), legumes. -can eat small amounts of dairy and lean meat (no larger than the palm of your hand), but avoid processed meats such as ham, bacon, lunch meat, etc. -drink water -try to avoid fast food and pre-packaged foods, processed meat, ultra processed foods/beverages (donuts, candy, etc.) -most experts advise limiting sodium to < 2300mg  per day, should limit further is any chronic conditions such as high blood pressure, heart disease, diabetes, etc. The American Heart Association advised that < 1500mg  is is ideal -try to avoid foods/beverages that contain any ingredients with names you do not recognize  -try to avoid foods/beverages  with added sugar or sweeteners/sweets  -try to avoid sweet drinks (including diet drinks): soda, juice, Gatorade, sweet tea, power drinks, diet drinks -try to avoid white rice, white bread, pasta (unless whole grain)  EXERCISE GUIDELINES FOR ADULTS: -if you wish to increase your physical activity, do so gradually and with the approval of your doctor -STOP and seek medical care immediately if you have any chest pain, chest discomfort or trouble breathing when starting or increasing exercise  -move and stretch your body, legs, feet and arms when sitting for long periods -Physical activity guidelines for optimal health in adults: -get at least 150 minutes per week of moderate exercise (can talk, but not sing); this is about 20-30 minutes of sustained activity 5-7 days per week or two 10-15 minute episodes of sustained activity 5-7 days per week -do some muscle building/resistance training/strength training  at least 2 days per week  -balance exercises 3+ days per week:   Stand somewhere where you  have something sturdy to hold onto if you lose balance    1) lift up on toes, then back down, start with 5x per day and work up to 20x   2) stand and lift one leg straight out to the side so that foot is a few inches of the floor, start with 5x each side and work up to 20x each side   3) stand on one foot, start with 5 seconds each side and work up to 20 seconds on each side  If you need ideas or help with getting more active:  -Silver sneakers https://tools.silversneakers.com  -Walk with a Doc: http://www.duncan-williams.com/  -try to include resistance (weight lifting/strength building) and balance exercises twice per week: or the following link for ideas: http://castillo-powell.com/  BuyDucts.dk  STRESS MANAGEMENT: -can try meditating, or just sitting quietly with deep breathing while intentionally relaxing all parts of your body for 5 minutes daily -if you need further help with stress, anxiety or depression please follow up with your primary doctor or contact the wonderful folks at [facility]: [phone]  SOCIAL CONNECTIONS: -options in [place] if you wish to engage in more social and exercise related activities:  -Silver sneakers https://tools.silversneakers.com  -Walk with a Doc: http://www.duncan-williams.com/  -Check out the [facility] Active Adults 50+ section on the [place] of [employer] (hiking clubs, book clubs, cards and games, chess, exercise classes, aquatic classes and much more) - see the website for details: https://www.-[place].gov/departments/parks-recreation/active-adults50  -YouTube has lots of exercise videos for different ages and abilities as well  -[facility] (a variety of indoor and outdoor inperson activities for adults). ([phone]. [address].  -Virtual Online Classes (a variety of topics): see seniorplanet.org or  call [phone]  -consider volunteering at a school, hospice center, church, senior center or elsewhere     ADVANCED HEALTHCARE DIRECTIVES:  [place] Advanced Directives assistance:   ExpressWeek.com.cy  Everyone should have advanced health care directives in place. This is so that you get the care you want, should you ever be in a situation where you are unable to make your own medical decisions.   From the [place] Advanced Directive Website: "Advance Health Care Directives are legal documents in which you give written instructions about your health care if, in the future, you cannot speak for yourself.   A health care power of attorney allows you to name a person you trust to make your health care decisions if you cannot make them yourself. A declaration of a desire for a natural death (or living will) is document, which states that you desire not to have your life prolonged by extraordinary measures if you have a terminal or incurable illness or if you are in a vegetative state. An advance instruction for mental health treatment makes a declaration of instructions, information and preferences regarding your mental health treatment. It also states that you are aware that the advance instruction authorizes a mental health treatment provider to act according to your wishes. It may also outline your consent or refusal of mental health treatment. A declaration of an anatomical gift allows anyone over the age of 60 to make a gift by will, organ donor card or other document."   Please see the following website or an elder law attorney for forms, FAQs and for completion of advanced directives: [place]  Print production planner Health Care Directives Advance Health Care  Directives (http://guzman.com/)  Or copy and paste the following to your web browser: PoshChat.fi

## 2024-04-06 ENCOUNTER — Encounter: Payer: Self-pay | Admitting: Family Medicine

## 2024-04-06 ENCOUNTER — Ambulatory Visit: Admitting: Family Medicine

## 2024-04-06 VITALS — BP 150/60 | HR 70 | Temp 98.0°F | Ht 66.0 in | Wt 109.7 lb

## 2024-04-06 DIAGNOSIS — Z0000 Encounter for general adult medical examination without abnormal findings: Secondary | ICD-10-CM

## 2024-04-06 DIAGNOSIS — I10 Essential (primary) hypertension: Secondary | ICD-10-CM

## 2024-04-06 DIAGNOSIS — Z131 Encounter for screening for diabetes mellitus: Secondary | ICD-10-CM | POA: Diagnosis not present

## 2024-04-06 DIAGNOSIS — Z13220 Encounter for screening for lipoid disorders: Secondary | ICD-10-CM

## 2024-04-06 LAB — LIPID PANEL
Cholesterol: 206 mg/dL — ABNORMAL HIGH (ref 0–200)
HDL: 63.6 mg/dL (ref >39.00)
LDL Cholesterol: 120 mg/dL — ABNORMAL HIGH (ref 0–99)
NonHDL: 142.11
Total CHOL/HDL Ratio: 3
Triglycerides: 109 mg/dL (ref 0.0–149.0)
VLDL: 21.8 mg/dL (ref 0.0–40.0)

## 2024-04-06 LAB — COMPREHENSIVE METABOLIC PANEL WITH GFR
ALT: 8 U/L (ref 0–35)
AST: 14 U/L (ref 0–37)
Albumin: 4.1 g/dL (ref 3.5–5.2)
Alkaline Phosphatase: 92 U/L (ref 39–117)
BUN: 11 mg/dL (ref 6–23)
CO2: 31 meq/L (ref 19–32)
Calcium: 9.3 mg/dL (ref 8.4–10.5)
Chloride: 99 meq/L (ref 96–112)
Creatinine, Ser: 0.63 mg/dL (ref 0.40–1.20)
GFR: 78.09 mL/min (ref >60.00)
Glucose, Bld: 99 mg/dL (ref 70–99)
Potassium: 4.2 meq/L (ref 3.5–5.1)
Sodium: 136 meq/L (ref 135–145)
Total Bilirubin: 0.4 mg/dL (ref 0.2–1.2)
Total Protein: 6.5 g/dL (ref 6.0–8.3)

## 2024-04-06 LAB — HEMOGLOBIN A1C: Hgb A1c MFr Bld: 5.7 % (ref 4.6–6.5)

## 2024-04-06 MED ORDER — AMLODIPINE BESYLATE 10 MG PO TABS: 10.0000 mg | ORAL_TABLET | Freq: Every day | ORAL | 1 refills | Status: DC

## 2024-04-06 MED ORDER — TELMISARTAN 20 MG PO TABS: 10.0000 mg | ORAL_TABLET | Freq: Every day | ORAL | 1 refills | Status: DC

## 2024-04-06 NOTE — Progress Notes (Signed)
 Complete physical exam  Patient: Jill Jordan   DOB: 12/31/1931   89 y.o. Female  MRN: [id number]  Subjective:    Chief Complaint  Patient presents with   Medical Management of Chronic Issues    Jill Jordan is a 89 y.o. female who presents today for a complete physical exam. She reports consuming a general diet. Pt goes out in her garden, does her own weed eating , etc. She generally feels well. She reports sleeping well. She does have additional problems to discuss today.    HTN-- BP remains in the 150s today, pt states she gets 140 at home, uses a wrist cuff at home. No chest pain, dizziness or SOB. We discussed adding telmisartan to her amlodipine  and she is agreeable.   Most recent fall risk assessment:    04/06/2024    2:00 PM  Fall Risk   Falls in the past year? 0  Number falls in past yr: 0  Injury with Fall? 0  Risk for fall due to : No Fall Risks  Follow up Falls evaluation completed     Most recent depression screenings:    03/25/2024    4:20 PM 12/17/2022    2:28 PM  PHQ 2/9 Scores  PHQ - 2 Score 0 0  PHQ- 9 Score 0 0    Vision:Not within last year  and states she will make an appointment and Dental: No current dental problems and Last dental visit: several years ago, has implants and dentures, no native teeth  Patient Active Problem List   Diagnosis Date Noted   Malignant neoplasm of upper-outer quadrant of left breast in female, estrogen receptor positive (HCC) 12/09/2022   HTN (hypertension) 09/19/2022   Breast mass, left 09/10/2022   White coat hypertension 03/31/2013   ELEVATED BLOOD PRESSURE WITHOUT DIAGNOSIS OF HYPERTENSION 03/31/2008      Patient Care Team: Aida House, MD as PCP - General (Family Medicine) Oza Blumenthal, MD as Consulting Physician (General Surgery) Cameron Cea, MD as Consulting Physician (Hematology and Oncology) Colie Dawes, MD as Attending Physician (Radiation Oncology)   Outpatient Medications Prior to Visit   Medication Sig   cholecalciferol 25 MCG (1000 UT) tablet Take 1,000 Units by mouth daily.   furosemide  (LASIX ) 20 MG tablet TAKE 0.5-1 TABLETS (10-20 MG TOTAL) BY MOUTH DAILY AS NEEDED.   Multiple Vitamins-Minerals (MULTIVITAMIN GUMMIES ADULT PO) Take by mouth daily.   traMADol  (ULTRAM ) 50 MG tablet Take 1 tablet (50 mg total) by mouth every 6 (six) hours as needed.   [DISCONTINUED] amLODipine  (NORVASC ) 10 MG tablet Take 1 tablet (10 mg total) by mouth daily.   [DISCONTINUED] anastrozole  (ARIMIDEX ) 1 MG tablet TAKE 1 TABLET BY MOUTH EVERY DAY   No facility-administered medications prior to visit.    Review of Systems  HENT:  Negative for hearing loss.   Eyes:  Negative for blurred vision.  Respiratory:  Negative for shortness of breath.   Cardiovascular:  Negative for chest pain.  Gastrointestinal: Negative.   Genitourinary: Negative.   Musculoskeletal:  Negative for back pain.  Neurological:  Negative for headaches.  Psychiatric/Behavioral:  Negative for depression.        Objective:     BP (!) 150/60   Pulse 70   Temp 98 F (36.7 C) (Oral)   Ht 5' 6 (1.676 m)   Wt 109 lb 11.2 oz (49.8 kg)   SpO2 93%   BMI 17.71 kg/m    Physical Exam Vitals reviewed.  Constitutional:  Appearance: Normal appearance. She is well-groomed and normal weight.  HENT:     Right Ear: Tympanic membrane and ear canal normal.     Left Ear: Tympanic membrane and ear canal normal.     Mouth/Throat:     Mouth: Mucous membranes are moist.     Pharynx: No posterior oropharyngeal erythema.   Eyes:     Conjunctiva/sclera: Conjunctivae normal.   Neck:     Thyroid : No thyromegaly.   Cardiovascular:     Rate and Rhythm: Normal rate and regular rhythm.     Pulses: Normal pulses.     Heart sounds: S1 normal and S2 normal.  Pulmonary:     Effort: Pulmonary effort is normal.     Breath sounds: Normal breath sounds and air entry.  Abdominal:     General: Abdomen is flat. Bowel sounds are  normal.     Palpations: Abdomen is soft.   Musculoskeletal:     Right lower leg: No edema.     Left lower leg: No edema.  Lymphadenopathy:     Cervical: No cervical adenopathy.   Neurological:     Mental Status: She is alert and oriented to person, place, and time. Mental status is at baseline.     Gait: Gait is intact.   Psychiatric:        Mood and Affect: Mood and affect normal.        Speech: Speech normal.        Behavior: Behavior normal.        Judgment: Judgment normal.         Assessment & Plan:    Routine Health Maintenance and Physical Exam  Immunization History  Administered Date(s) Administered   PFIZER(Purple Top)SARS-COV-2 Vaccination 12/12/2019, 01/05/2020   Pneumococcal Polysaccharide-23 12/18/2012   Tdap 12/18/2012    Health Maintenance  Topic Date Due   DEXA SCAN  Never done   Pneumococcal Vaccine: 50+ Years (2 of 2 - PCV) 12/18/2013   COVID-19 Vaccine (3 - Pfizer risk series) 04/10/2024 (Originally 02/02/2020)   Zoster Vaccines- Shingrix (1 of 2) 06/25/2024 (Originally 10/30/1950)   DTaP/Tdap/Td (2 - Td or Tdap) 03/25/2025 (Originally 12/18/2022)   INFLUENZA VACCINE  05/21/2024   MAMMOGRAM  10/22/2024   Medicare Annual Wellness (AWV)  03/25/2025   HPV VACCINES  Aged Out   Meningococcal B Vaccine  Aged Out    Discussed health benefits of physical activity, and encouraged her to engage in regular exercise appropriate for her age and condition.  Primary hypertension Assessment & Plan: Pt reports she is getting 140's systolic at home. In the office she remains elevated with multiple checks. I advised that we add a small dose of telmisartan to the amlodipine  to help get her to her goal and she Is agreaable. Will see her back in 3 months for a BP recheck.   Orders: -     Telmisartan; Take 0.5 tablets (10 mg total) by mouth daily.  Dispense: 45 tablet; Refill: 1 -     amLODIPine  Besylate; Take 1 tablet (10 mg total) by mouth daily.  Dispense: 90  tablet; Refill: 1 -     Comprehensive metabolic panel with GFR; Future  Lipid screening -     Lipid panel; Future  Diabetes mellitus screening -     Hemoglobin A1c; Future  Routine adult health maintenance  Normal physical exam findings except for elevated blood pressure which is asymptomatic and chronic. I counseled the patient on the recommended amount of exercise per  CDC recommendation. I reviewed preventative screening, immunizations, and medical history and updated in the chart, and appropriate labs and vaccinations were ordered. Handouts given on healthy eating and exercise.    Return in about 3 months (around 07/07/2024) for HTN.     Aida House, MD

## 2024-04-07 NOTE — Assessment & Plan Note (Signed)
 Pt reports she is getting 140's systolic at home. In the office she remains elevated with multiple checks. I advised that we add a small dose of telmisartan to the amlodipine  to help get her to her goal and she Is agreaable. Will see her back in 3 months for a BP recheck.

## 2024-04-12 ENCOUNTER — Ambulatory Visit: Payer: Self-pay | Admitting: Family Medicine

## 2024-05-04 ENCOUNTER — Other Ambulatory Visit: Payer: Medicare Other

## 2024-05-13 ENCOUNTER — Ambulatory Visit (HOSPITAL_BASED_OUTPATIENT_CLINIC_OR_DEPARTMENT_OTHER)
Admission: RE | Admit: 2024-05-13 | Discharge: 2024-05-13 | Disposition: A | Discharge status: Home / Self Care | Source: Ambulatory Visit | Attending: Adult Health | Admitting: Adult Health

## 2024-05-13 DIAGNOSIS — Z13820 Encounter for screening for osteoporosis: Secondary | ICD-10-CM | POA: Insufficient documentation

## 2024-05-13 DIAGNOSIS — Z79811 Long term (current) use of aromatase inhibitors: Secondary | ICD-10-CM | POA: Diagnosis present

## 2024-05-19 ENCOUNTER — Other Ambulatory Visit: Payer: Self-pay

## 2024-05-19 ENCOUNTER — Ambulatory Visit: Payer: Self-pay

## 2024-05-19 NOTE — Telephone Encounter (Signed)
 Anastrozole  has been added back to her medication list as taking

## 2024-05-19 NOTE — Telephone Encounter (Signed)
-----   Message from Morna JAYSON Kendall sent at 05/19/2024  1:48 PM EDT ----- Please call patient with results.  She has osteoporosis.  Is she taking the anastrozole ?  I do not see it on her medication list.  Whether she is taking it or not we will impact management decisions. ----- Message ----- From: Interface, Rad Results In Sent: 05/14/2024   4:47 AM EDT To: Morna Dalton Kendall, NP

## 2024-05-19 NOTE — Telephone Encounter (Signed)
 Pt advised of bone density results with VU.  She is taking the Anastrozole  as directed.  It was marked as completed course at her last office visit

## 2024-07-20 ENCOUNTER — Inpatient Hospital Stay: Attending: Hematology and Oncology | Admitting: Hematology and Oncology

## 2024-07-20 VITALS — BP 167/67 | HR 79 | Temp 97.7°F | Resp 16 | Ht 66.0 in | Wt 111.2 lb

## 2024-07-20 DIAGNOSIS — Z79811 Long term (current) use of aromatase inhibitors: Secondary | ICD-10-CM | POA: Diagnosis not present

## 2024-07-20 DIAGNOSIS — Z170 Estrogen receptor positive status [ER+]: Secondary | ICD-10-CM | POA: Diagnosis not present

## 2024-07-20 DIAGNOSIS — C50412 Malignant neoplasm of upper-outer quadrant of left female breast: Secondary | ICD-10-CM | POA: Insufficient documentation

## 2024-07-20 DIAGNOSIS — Z923 Personal history of irradiation: Secondary | ICD-10-CM | POA: Insufficient documentation

## 2024-07-20 NOTE — Progress Notes (Signed)
 Patient Care Team: Ozell Heron HERO, MD as PCP - General (Family Medicine) Vernetta Berg, MD as Consulting Physician (General Surgery) Odean Potts, MD as Consulting Physician (Hematology and Oncology) Izell Domino, MD as Attending Physician (Radiation Oncology)  DIAGNOSIS:  Encounter Diagnosis  Name Primary?   Malignant neoplasm of upper-outer quadrant of left breast in female, estrogen receptor positive (HCC) Yes    SUMMARY OF ONCOLOGIC HISTORY: Oncology History  Malignant neoplasm of upper-outer quadrant of left breast in female, estrogen receptor positive (HCC)  12/02/2022 Initial Diagnosis   Palpable left breast lump at 2 o'clock position measuring 2.8 cm axilla negative biopsy revealed grade 2 IDC with mucinous features ER 100%, PR 0%, Ki-67 10%, HER2 negative   12/11/2022 Cancer Staging   Staging form: Breast, AJCC 8th Edition - Clinical: Stage IIA (cT2, cN0, cM0, G2, ER+, PR-, HER2-) - Signed by Odean Potts, MD on 12/11/2022 Histologic grading system: 3 grade system   01/02/2023 Surgery   Left breast lumpectomy: IDC, 2.8 cm, grade 2, superior margin positive,    01/02/2023 Cancer Staging   Staging form: Breast, AJCC 8th Edition - Pathologic stage from 01/02/2023: Stage IIA (pT2, pN0, cM0, G2, ER+, PR-, HER2-) - Signed by Crawford Morna Pickle, NP on 06/30/2023 Histologic grading system: 3 grade system   01/30/2023 Surgery   Re-excision to clear margins   03/11/2023 - 03/18/2023 Radiation Therapy   Plan Name: Breast_L Site: Breast, Left Technique: 3D Mode: Photon Dose Per Fraction: 5.2 Gy Prescribed Dose (Delivered / Prescribed): 26 Gy / 26 Gy Prescribed Fxs (Delivered / Prescribed): 5 / 5   03/2023 -  Anti-estrogen oral therapy   Anastrozole      CHIEF COMPLIANT: Follow-up on anastrozole  therapy  HISTORY OF PRESENT ILLNESS:   History of Present Illness Jill Jordan is a 89 year old female who presents for follow-up after completing a year of  anastrozole  treatment.  She has completed a one-year course of anastrozole  for estrogen receptor-positive breast cancer with adherence to the medication regimen and no significant pain. Blood pressure readings are elevated during medical visits but remain stable at home. She is on amlodipine  for hypertension management.     ALLERGIES:  has no known allergies.  MEDICATIONS:  Current Outpatient Medications  Medication Sig Dispense Refill   amLODipine  (NORVASC ) 10 MG tablet Take 1 tablet (10 mg total) by mouth daily. 90 tablet 1   cholecalciferol 25 MCG (1000 UT) tablet Take 1,000 Units by mouth daily.     furosemide  (LASIX ) 20 MG tablet TAKE 0.5-1 TABLETS (10-20 MG TOTAL) BY MOUTH DAILY AS NEEDED. 90 tablet 1   Multiple Vitamins-Minerals (MULTIVITAMIN GUMMIES ADULT PO) Take by mouth daily.     telmisartan  (MICARDIS ) 20 MG tablet Take 0.5 tablets (10 mg total) by mouth daily. 45 tablet 1   traMADol  (ULTRAM ) 50 MG tablet Take 1 tablet (50 mg total) by mouth every 6 (six) hours as needed. 15 tablet 0   No current facility-administered medications for this visit.    PHYSICAL EXAMINATION: ECOG PERFORMANCE STATUS: 1 - Symptomatic but completely ambulatory  Vitals:   07/20/24 1434  BP: (!) 167/67  Pulse: 79  Resp: 16  Temp: 97.7 F (36.5 C)  SpO2: 97%   Filed Weights   07/20/24 1434  Weight: 111 lb 3.2 oz (50.4 kg)      LABORATORY DATA:  I have reviewed the data as listed    Latest Ref Rng & Units 04/06/2024    2:42 PM 01/30/2023  9:39 AM 12/11/2022   12:29 PM  CMP  Glucose 70 - 99 mg/dL 99  887  829   BUN 6 - 23 mg/dL 11  14  14    Creatinine 0.40 - 1.20 mg/dL 9.36  9.30  9.27   Sodium 135 - 145 mEq/L 136  136  136   Potassium 3.5 - 5.1 mEq/L 4.2  4.0  3.6   Chloride 96 - 112 mEq/L 99  104  102   CO2 19 - 32 mEq/L 31  21  28    Calcium 8.4 - 10.5 mg/dL 9.3  8.8  8.8   Total Protein 6.0 - 8.3 g/dL 6.5   6.5   Total Bilirubin 0.2 - 1.2 mg/dL 0.4   0.5   Alkaline Phos 39 -  117 U/L 92   84   AST 0 - 37 U/L 14   10   ALT 0 - 35 U/L 8   6     Lab Results  Component Value Date   WBC 5.5 01/30/2023   HGB 13.8 01/30/2023   HCT 43.4 01/30/2023   MCV 88.4 01/30/2023   PLT 333 01/30/2023   NEUTROABS 4.3 12/11/2022    ASSESSMENT & PLAN:  Malignant neoplasm of upper-outer quadrant of left breast in female, estrogen receptor positive (HCC) 12/02/2022:Palpable left breast lump at 2 o'clock position measuring 2.8 cm axilla negative biopsy revealed grade 2 IDC with mucinous features ER 100%, PR 0%, Ki-67 10%, HER2 negative  01/02/2023: Left lumpectomy: Grade 2 IDC with mucinous features 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 Since she completed a year and a half of the treatment we decided to discontinue anastrozole  at this time.  Breast cancer surveillance: Breast exam 07/20/2024: Benign Mammogram 10/23/2023: Benign breast density category B  Return to clinic in 1 year for follow-up    No orders of the defined types were placed in this encounter.  The patient has a good understanding of the overall plan. she agrees with it. she will call with any problems that may develop before the next visit here. Total time spent: 30 mins including face to face time and time spent for planning, charting and co-ordination of care   Viinay K Wilena Tyndall, MD 07/20/24

## 2024-07-20 NOTE — Assessment & Plan Note (Signed)
 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

## 2024-08-06 ENCOUNTER — Ambulatory Visit (INDEPENDENT_AMBULATORY_CARE_PROVIDER_SITE_OTHER): Admitting: Family Medicine

## 2024-08-06 ENCOUNTER — Encounter: Payer: Self-pay | Admitting: Family Medicine

## 2024-08-06 VITALS — BP 138/60 | HR 75 | Temp 98.0°F | Ht 63.75 in | Wt 110.4 lb

## 2024-08-06 DIAGNOSIS — I10 Essential (primary) hypertension: Secondary | ICD-10-CM

## 2024-08-06 DIAGNOSIS — Z23 Encounter for immunization: Secondary | ICD-10-CM | POA: Diagnosis not present

## 2024-08-06 MED ORDER — TELMISARTAN 20 MG PO TABS: 10.0000 mg | ORAL_TABLET | Freq: Every day | ORAL | 1 refills | Status: AC

## 2024-08-06 MED ORDER — AMLODIPINE BESYLATE 10 MG PO TABS: 10.0000 mg | ORAL_TABLET | Freq: Every day | ORAL | 1 refills | Status: AC

## 2024-08-06 NOTE — Progress Notes (Unsigned)
   Established Patient Office Visit  Subjective   Patient ID: Jill Jordan, female    DOB: May 07, 1932  Age: 89 y.o. MRN: [id number]  Chief Complaint  Patient presents with  . Medicare Wellness    HPI   Current Outpatient Medications  Medication Instructions  . amLODipine  (NORVASC ) 10 mg, Oral, Daily  . Blood Pressure Monitoring (BLOOD PRESSURE CUFF) MISC 1 kit, Does not apply,  Once  . cholecalciferol (VITAMIN D3) 1,000 Units, Daily  . Multiple Vitamins-Minerals (MULTIVITAMIN GUMMIES ADULT PO) Daily  . telmisartan  (MICARDIS ) 10 mg, Oral, Daily    Patient Active Problem List   Diagnosis Date Noted  . Malignant neoplasm of upper-outer quadrant of left breast in female, estrogen receptor positive (HCC) 12/09/2022  . HTN (hypertension) 09/19/2022  . Breast mass, left 09/10/2022  . White coat hypertension 03/31/2013  . ELEVATED BLOOD PRESSURE WITHOUT DIAGNOSIS OF HYPERTENSION 03/31/2008     Review of Systems  All other systems reviewed and are negative.     Objective:     BP 138/60   Pulse 75   Temp 98 F (36.7 C) (Oral)   Ht 5' 3.75 (1.619 m)   Wt 110 lb 6.4 oz (50.1 kg)   SpO2 98%   BMI 19.10 kg/m  {Vitals History (Optional):23777}  Physical Exam Vitals reviewed.      No results found for any visits on 08/06/24.  {Labs (Optional):23779}  The ASCVD Risk score (Arnett DK, et al., 2019) failed to calculate for the following reasons:   The 2019 ASCVD risk score is only valid for ages 68 to 30    Assessment & Plan:  Primary hypertension -     Blood Pressure Cuff; 1 kit by Does not apply route once for 1 dose.  Dispense: 1 each; Refill: 0 -     Telmisartan ; Take 0.5 tablets (10 mg total) by mouth daily.  Dispense: 45 tablet; Refill: 1 -     amLODIPine  Besylate; Take 1 tablet (10 mg total) by mouth daily.  Dispense: 90 tablet; Refill: 1  Immunization due -     Pneumococcal conjugate vaccine 20-valent     Return in about 8 months (around 04/07/2025) for  annual physical exam, AWV.    Heron CHRISTELLA Sharper, MD

## 2024-08-27 NOTE — Addendum Note (Signed)
 Addended by: CHRISTYNE AMBLE A on: 08/27/2024 10:00 AM   Modules accepted: Orders

## 2024-09-18 ENCOUNTER — Other Ambulatory Visit: Payer: Self-pay | Admitting: Hematology and Oncology

## 2024-11-11 ENCOUNTER — Other Ambulatory Visit: Payer: Self-pay | Admitting: Family Medicine

## 2024-11-11 DIAGNOSIS — Z853 Personal history of malignant neoplasm of breast: Secondary | ICD-10-CM

## 2024-12-03 ENCOUNTER — Encounter

## 2025-04-15 ENCOUNTER — Encounter: Admitting: Family Medicine
